# Patient Record
Sex: Male | Born: 1985 | Race: White | Hispanic: No | Marital: Single | Smoking: Current every day smoker
Health system: Southern US, Community
[De-identification: ages and names within clinical notes are randomized; demographics above are authoritative.]

## PROBLEM LIST (undated history)

## (undated) DIAGNOSIS — H547 Unspecified visual loss: Secondary | ICD-10-CM

## (undated) DIAGNOSIS — Z8781 Personal history of (healed) traumatic fracture: Secondary | ICD-10-CM

## (undated) HISTORY — PX: EYE SURGERY: SHX253

---
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
## 2005-04-10 ENCOUNTER — Emergency Department (HOSPITAL_COMMUNITY): Admission: EM | Admit: 2005-04-10 | Discharge: 2005-04-10 | Payer: Self-pay | Admitting: Emergency Medicine

## 2005-10-24 ENCOUNTER — Emergency Department (HOSPITAL_COMMUNITY): Admission: EM | Admit: 2005-10-24 | Discharge: 2005-10-24 | Payer: Self-pay | Admitting: Emergency Medicine

## 2009-02-14 ENCOUNTER — Emergency Department (HOSPITAL_COMMUNITY): Admission: EM | Admit: 2009-02-14 | Discharge: 2009-02-14 | Payer: Self-pay | Admitting: Emergency Medicine

## 2009-03-02 ENCOUNTER — Emergency Department (HOSPITAL_COMMUNITY): Admission: EM | Admit: 2009-03-02 | Discharge: 2009-03-02 | Payer: Self-pay | Admitting: Emergency Medicine

## 2010-04-09 ENCOUNTER — Emergency Department (HOSPITAL_COMMUNITY)
Admission: EM | Admit: 2010-04-09 | Discharge: 2010-04-09 | Discharge status: Against Medical Advice | Payer: Self-pay | Attending: Emergency Medicine | Admitting: Emergency Medicine

## 2010-04-09 DIAGNOSIS — H547 Unspecified visual loss: Secondary | ICD-10-CM | POA: Insufficient documentation

## 2010-04-09 DIAGNOSIS — H5710 Ocular pain, unspecified eye: Secondary | ICD-10-CM | POA: Insufficient documentation

## 2010-04-09 DIAGNOSIS — H538 Other visual disturbances: Secondary | ICD-10-CM | POA: Insufficient documentation

## 2010-04-27 ENCOUNTER — Emergency Department (HOSPITAL_COMMUNITY): Payer: Self-pay

## 2010-04-27 ENCOUNTER — Emergency Department (HOSPITAL_COMMUNITY)
Admission: EM | Admit: 2010-04-27 | Discharge: 2010-04-27 | Disposition: A | Discharge status: Home / Self Care | Payer: Self-pay | Attending: Emergency Medicine | Admitting: Emergency Medicine

## 2010-04-27 DIAGNOSIS — S12200A Unspecified displaced fracture of third cervical vertebra, initial encounter for closed fracture: Secondary | ICD-10-CM | POA: Insufficient documentation

## 2010-04-27 DIAGNOSIS — W108XXA Fall (on) (from) other stairs and steps, initial encounter: Secondary | ICD-10-CM | POA: Insufficient documentation

## 2010-04-27 DIAGNOSIS — F17200 Nicotine dependence, unspecified, uncomplicated: Secondary | ICD-10-CM | POA: Insufficient documentation

## 2010-04-27 DIAGNOSIS — M542 Cervicalgia: Secondary | ICD-10-CM | POA: Insufficient documentation

## 2010-04-27 DIAGNOSIS — S12300A Unspecified displaced fracture of fourth cervical vertebra, initial encounter for closed fracture: Secondary | ICD-10-CM | POA: Insufficient documentation

## 2010-05-26 NOTE — Consult Note (Signed)
NAMEBRONSEN, SERANO         ACCOUNT NO.:  [id number]  MEDICAL RECORD NO.:  [id number]           PATIENT TYPE:  LOCATION:                                 FACILITY:  PHYSICIAN:  Coletta Memos, M.D.     DATE OF BIRTH:  02/16/1985  DATE OF CONSULTATION: DATE OF DISCHARGE:                                CONSULTATION   CHIEF COMPLAINT:  Cervical spine fracture.  INDICATIONS:  Brendan Sutton is a 25 year old gentleman who while outside slipped on some stairs 2 days ago.  He fell backward hitting his head and then he says he flipped over and hit his head again on the ground. He afterwards had pain in his neck which is increased over the last few days.  He also had some pain in his right lower extremity that improved. He has not had any weakness.  He has had no problems with his balance. No problems with bowel or bladder function.  He has had no significant numbness or tingling.  He says that the pain simply became too bad for him to deal with at home.  He had to be helped of out of bed today, because whenever he tried to raise his head he had a significant amount of pain.  ALLERGIES:  He has some allergies, but no known drug allergies.  SOCIAL HISTORY:  He has no history of drug abuse.  He does smoke tobacco.  He does use alcohol occasionally.  PAST SURGICAL HISTORY:  No previous surgeries.  FAMILY HISTORY:  Provided no family history of medical problems.  REVIEW OF SYSTEMS:  Negative for constitutional, ear, nose, throat, cardiovascular, genitourinary, skin, neurological, psychiatric, endocrine, hematologic, gastrointestinal or allergic problems.  He simply reports neck pain for the last few days.  He does have some abrasions about his arms, his upper extremities and he says that those are due to his fall.  PHYSICAL EXAMINATION:  VITAL SIGNS:  Blood pressure 137/86, pulse 92, respiratory rate 18, temperature 98.2. GENERAL:  He is alert and oriented x4 and answering all  questions appropriately.  Memory language, attention, span and fund of knowledge normal.  Well kempt and in no distress. NEUROLOGIC:  Pupils equal, round, and reactive to light.  Full extraocular movements.  Full visual fields.  Hearing intact to finger rub bilaterally.  Uvula elevates midline.  Shoulder shrug is normal. Tongue protrudes in midline.  Speech is clear and fluent.  Fund of knowledge is normal.  5/5 strength in the upper and lower extremities. 2+ reflexes in the upper and lower extremities.  Intact proprioception. No Hoffman sign.  No clonus.  Toes downgoing.  He has a normal gait. Negative Romberg test. LUNG:  Fields clear. HEART:  Regular rhythm and rate.  No murmurs or rubs.  Pulses are good at the wrist bilaterally.  No cervical masses or bruits.  CT shows a fracture of the pedicle on the right side at C3 which involves the superior articulating process.  He also has a fracture of C4 on the right side in the inferior articular process and lamina.  He maintains a normal alignment.  He does have what almost look like old  compression fractures despite he gives no prior history of significant trauma to his cervical spine at C5 and also slightly so at C6.  Spinal canal is widely patent on the CT and plain films.  I will provide Brendan Sutton with a prescription for pain medication and muscle relaxant.  He says this pain is in 8/10 now, but as I explained some he did break his neck and his expected that he is going to have neck pain.  He has two collars are [place] collar and an Biochemist, clinical.  I instructed him to wear the [place] collar when he takes a shower, to remove that while lying down and use in place the [place] collar.  He needs to wear that 24 hours per day.  I will give him a return appointment to see me in approximately 1 month with plain films for assessment of the cervical spine alignment.          ______________________________ Coletta Memos,  M.D.     KC/MEDQ  D:  04/27/2010  T:  04/28/2010  Job:  [id number]  Electronically Signed by Coletta Memos M.D. on 05/26/2010 11:46:08 AM

## 2010-06-28 ENCOUNTER — Emergency Department (HOSPITAL_COMMUNITY): Payer: Self-pay

## 2010-06-28 ENCOUNTER — Emergency Department (HOSPITAL_COMMUNITY)
Admission: EM | Admit: 2010-06-28 | Discharge: 2010-06-29 | Disposition: A | Discharge status: Home / Self Care | Payer: Self-pay | Attending: Emergency Medicine | Admitting: Emergency Medicine

## 2010-06-28 DIAGNOSIS — S61409A Unspecified open wound of unspecified hand, initial encounter: Secondary | ICD-10-CM | POA: Insufficient documentation

## 2010-06-28 DIAGNOSIS — IMO0002 Reserved for concepts with insufficient information to code with codable children: Secondary | ICD-10-CM | POA: Insufficient documentation

## 2010-06-28 DIAGNOSIS — S0510XA Contusion of eyeball and orbital tissues, unspecified eye, initial encounter: Secondary | ICD-10-CM | POA: Insufficient documentation

## 2010-06-28 DIAGNOSIS — H5789 Other specified disorders of eye and adnexa: Secondary | ICD-10-CM | POA: Insufficient documentation

## 2010-06-28 DIAGNOSIS — H538 Other visual disturbances: Secondary | ICD-10-CM | POA: Insufficient documentation

## 2010-06-28 DIAGNOSIS — S058X9A Other injuries of unspecified eye and orbit, initial encounter: Secondary | ICD-10-CM | POA: Insufficient documentation

## 2010-06-28 DIAGNOSIS — R51 Headache: Secondary | ICD-10-CM | POA: Insufficient documentation

## 2010-06-28 DIAGNOSIS — H5710 Ocular pain, unspecified eye: Secondary | ICD-10-CM | POA: Insufficient documentation

## 2010-06-28 DIAGNOSIS — M542 Cervicalgia: Secondary | ICD-10-CM | POA: Insufficient documentation

## 2010-06-28 DIAGNOSIS — H1130 Conjunctival hemorrhage, unspecified eye: Secondary | ICD-10-CM | POA: Insufficient documentation

## 2010-06-28 DIAGNOSIS — G8929 Other chronic pain: Secondary | ICD-10-CM | POA: Insufficient documentation

## 2010-09-11 ENCOUNTER — Emergency Department (HOSPITAL_COMMUNITY)
Admission: EM | Admit: 2010-09-11 | Discharge: 2010-09-11 | Discharge status: Against Medical Advice | Payer: Self-pay | Attending: Emergency Medicine | Admitting: Emergency Medicine

## 2010-09-11 DIAGNOSIS — T148XXA Other injury of unspecified body region, initial encounter: Secondary | ICD-10-CM | POA: Insufficient documentation

## 2010-09-12 ENCOUNTER — Inpatient Hospital Stay (INDEPENDENT_AMBULATORY_CARE_PROVIDER_SITE_OTHER)
Admission: RE | Admit: 2010-09-12 | Discharge: 2010-09-12 | Disposition: A | Discharge status: Home / Self Care | Payer: Self-pay | Source: Ambulatory Visit | Attending: Emergency Medicine | Admitting: Emergency Medicine

## 2010-09-12 ENCOUNTER — Ambulatory Visit (INDEPENDENT_AMBULATORY_CARE_PROVIDER_SITE_OTHER): Payer: Self-pay

## 2010-09-12 DIAGNOSIS — IMO0002 Reserved for concepts with insufficient information to code with codable children: Secondary | ICD-10-CM

## 2010-09-12 DIAGNOSIS — S5000XA Contusion of unspecified elbow, initial encounter: Secondary | ICD-10-CM

## 2011-09-03 ENCOUNTER — Emergency Department (HOSPITAL_COMMUNITY): Payer: Self-pay

## 2011-09-03 ENCOUNTER — Encounter (HOSPITAL_COMMUNITY): Payer: Self-pay | Admitting: Family Medicine

## 2011-09-03 ENCOUNTER — Emergency Department (HOSPITAL_COMMUNITY)
Admission: EM | Admit: 2011-09-03 | Discharge: 2011-09-03 | Disposition: A | Discharge status: Home / Self Care | Payer: Self-pay | Attending: Emergency Medicine | Admitting: Emergency Medicine

## 2011-09-03 DIAGNOSIS — K529 Noninfective gastroenteritis and colitis, unspecified: Secondary | ICD-10-CM

## 2011-09-03 DIAGNOSIS — R1031 Right lower quadrant pain: Secondary | ICD-10-CM | POA: Insufficient documentation

## 2011-09-03 DIAGNOSIS — R109 Unspecified abdominal pain: Secondary | ICD-10-CM

## 2011-09-03 LAB — CBC WITH DIFFERENTIAL/PLATELET
Basophils Absolute: 0.1 10*3/uL (ref 0.0–0.1)
Basophils Relative: 1 % (ref 0–1)
Eosinophils Absolute: 0.4 10*3/uL (ref 0.0–0.7)
Eosinophils Relative: 4 % (ref 0–5)
Lymphocytes Relative: 39 % (ref 12–46)
MCH: 30.8 pg (ref 26.0–34.0)
MCHC: 34.8 g/dL (ref 30.0–36.0)
MCV: 88.7 fL (ref 78.0–100.0)
Platelets: 347 10*3/uL (ref 150–400)
RDW: 13.5 % (ref 11.5–15.5)
WBC: 8.9 10*3/uL (ref 4.0–10.5)

## 2011-09-03 LAB — URINALYSIS, ROUTINE W REFLEX MICROSCOPIC
Hgb urine dipstick: NEGATIVE
Protein, ur: NEGATIVE mg/dL
Urobilinogen, UA: 0.2 mg/dL (ref 0.0–1.0)

## 2011-09-03 LAB — COMPREHENSIVE METABOLIC PANEL
ALT: 21 U/L (ref 0–53)
AST: 17 U/L (ref 0–37)
Albumin: 4.1 g/dL (ref 3.5–5.2)
Calcium: 9.7 mg/dL (ref 8.4–10.5)
Sodium: 141 mEq/L (ref 135–145)
Total Protein: 7.4 g/dL (ref 6.0–8.3)

## 2011-09-03 MED ORDER — SODIUM CHLORIDE 0.9 % IV BOLUS (SEPSIS)
1000.0000 mL | INTRAVENOUS | Status: AC
  Administered 2011-09-03: 1000 mL via INTRAVENOUS

## 2011-09-03 MED ORDER — OXYCODONE-ACETAMINOPHEN 5-325 MG PO TABS: 1.0000 | ORAL_TABLET | Freq: Four times a day (QID) | ORAL | Status: AC | PRN

## 2011-09-03 MED ORDER — MORPHINE SULFATE 4 MG/ML IJ SOLN
4.0000 mg | Freq: Once | INTRAMUSCULAR | Status: AC
  Administered 2011-09-03: 4 mg via INTRAVENOUS
  Filled 2011-09-03: qty 1

## 2011-09-03 MED ORDER — IOHEXOL 300 MG/ML  SOLN
100.0000 mL | Freq: Once | INTRAMUSCULAR | Status: AC | PRN
  Administered 2011-09-03: 100 mL via INTRAVENOUS

## 2011-09-03 MED ORDER — ONDANSETRON HCL 4 MG/2ML IJ SOLN
4.0000 mg | INTRAMUSCULAR | Status: AC
  Administered 2011-09-03: 4 mg via INTRAVENOUS
  Filled 2011-09-03: qty 2

## 2011-09-03 NOTE — ED Notes (Addendum)
Patient C/O abdominal pain that has been ongoing for 3 days.  Worse pain is in RUQ.  Bowel sounds are present X 4.  RLQ and LLQ are non tender to palpation.  RUQ is tender to palpation. Palpating LUQ makes RUQ hurt worse. Denies Nausea, Vomiting  And diarrhea. States that he is eating well.  Denies fever.

## 2011-09-03 NOTE — ED Notes (Signed)
Patient was explained discharge instructions and he had no questions. Patient's significant other is transporting home.

## 2011-09-03 NOTE — ED Notes (Signed)
Per pt sts RLQ pain radiating across his abdomen since yesterday. Denies N,V,D. Denies urinary symptoms.

## 2011-09-03 NOTE — ED Provider Notes (Signed)
Patient care passed on from [facility], PA-C. 26 y/o male with abdominal pain moved to CDU to await CT to r/o appendicitis. CT scan unremarkable. Patient admits to eating a hamburger that had freezer burn on Saturday prior to onset of symptoms. Denies any nausea or vomiting. Pain currently 6/10 down from 8/10. On exam, patient in NAD. Heart RRR. Lungs CTA. Abdomen soft with normal BS. TTP of right mid-upper quadrant and mid-epigastric region. No guarding or rebound tenderness. Will discharge with instructions for a bland diet and pain control.  Trevor Mace, PA-C 09/03/11 1954

## 2011-09-03 NOTE — ED Notes (Signed)
Called CT to notify pt finished with contrast

## 2011-09-03 NOTE — ED Provider Notes (Signed)
History     CSN: [id number]  Arrival date & time 09/03/11  1457   First MD Initiated Contact with Patient 09/03/11 1624      Chief Complaint  Patient presents with  . Abdominal Pain    (Consider location/radiation/quality/duration/timing/severity/associated sxs/prior treatment) The history is provided by the patient, medical records and the spouse.   Brendan Sutton is a 26 y.o. male presents to the emergency department complaining of abdominal pain.  The onset of the symptoms was  gradual starting 3 days ago.  The patient has no associated symptoms.  The symptoms have been  constant, gradually worsened.  Increasing abdominal pressure makes the symptoms worse and nothing makes symptoms better.  The patient denies fever, chills, headache, chest pain, shortness of breath, , vomiting, diarrhea constipation.  Patient states the pain began Saturday afternoon after spending time with his niece waterslide.  It did not really bother him on Saturday however pain increased to a 10 out of 10 yesterday.  He has tried TUMS, Maalox, Pepto-Bismol without relief.  He states the pain was initially located periumbilical and is now more intense in the right lower quadrant and is radiating into the left lower quadrant.  Describes the pain as sharp and constant, currently rated a 7/10.  He's been afebrile at home.  CT had an appetite and has been able to eat and drink normally.  The patient has medical history significant for: History reviewed. No pertinent past medical history.   History reviewed. No pertinent past medical history.  History reviewed. No pertinent past surgical history.  History reviewed. No pertinent family history.  History  Substance Use Topics  . Smoking status: Current Everyday Smoker  . Smokeless tobacco: Not on file  . Alcohol Use: Yes      Review of Systems  Constitutional: Negative for fever, diaphoresis, appetite change, fatigue and unexpected weight change.  HENT:  Negative for mouth sores, trouble swallowing, neck pain and neck stiffness.   Respiratory: Negative for cough, chest tightness, shortness of breath, wheezing and stridor.   Cardiovascular: Negative for chest pain and palpitations.  Gastrointestinal: Positive for abdominal pain. Negative for nausea, vomiting, diarrhea, constipation, blood in stool, abdominal distention and rectal pain.  Genitourinary: Negative for dysuria, urgency, frequency, hematuria, flank pain and difficulty urinating.  Musculoskeletal: Negative for back pain.  Skin: Negative for rash.  Neurological: Negative for weakness.  Hematological: Negative for adenopathy.  Psychiatric/Behavioral: Negative for confusion.  All other systems reviewed and are negative.    Allergies  Review of patient's allergies indicates no known allergies.  Home Medications   Current Outpatient Rx  Name Route Sig Dispense Refill  . IBUPROFEN 200 MG PO TABS Oral Take 400 mg by mouth every 6 (six) hours as needed. For pain      BP 133/80  Pulse 98  Temp 97.9 F (36.6 C) (Oral)  Resp 16  SpO2 98%  Physical Exam  Nursing note and vitals reviewed. Constitutional: He appears well-developed and well-nourished.  HENT:  Head: Normocephalic and atraumatic.  Mouth/Throat: Oropharynx is clear and moist.  Eyes: Conjunctivae are normal. No scleral icterus.  Cardiovascular: Normal rate, regular rhythm and intact distal pulses.   Pulmonary/Chest: Effort normal and breath sounds normal.  Abdominal: Soft. Normal appearance and bowel sounds are normal. He exhibits no distension and no mass. There is no hepatosplenomegaly. There is tenderness (worst in the right lower quadrant) in the right lower quadrant, periumbilical area, suprapubic area and left lower quadrant. There is rebound,  guarding and tenderness at McBurney's point. There is no CVA tenderness and negative Murphy's sign.       Positive Rovsing's   Neurological: He is alert.  Skin: Skin is  warm and dry. No rash noted.  Psychiatric: He has a normal mood and affect.    ED Course  Procedures (including critical care time)  Labs Reviewed  CBC WITH DIFFERENTIAL - Abnormal; Notable for the following:    Monocytes Absolute 1.1 (*)     All other components within normal limits  COMPREHENSIVE METABOLIC PANEL - Abnormal; Notable for the following:    Total Bilirubin 0.1 (*)     All other components within normal limits  URINALYSIS, ROUTINE W REFLEX MICROSCOPIC   No results found.  Results for orders placed during the hospital encounter of 09/03/11  URINALYSIS, ROUTINE W REFLEX MICROSCOPIC      Component Value Range   Color, Urine YELLOW  YELLOW   APPearance CLEAR  CLEAR   Specific Gravity, Urine 1.023  1.005 - 1.030   pH 6.5  5.0 - 8.0   Glucose, UA NEGATIVE  NEGATIVE mg/dL   Hgb urine dipstick NEGATIVE  NEGATIVE   Bilirubin Urine NEGATIVE  NEGATIVE   Ketones, ur NEGATIVE  NEGATIVE mg/dL   Protein, ur NEGATIVE  NEGATIVE mg/dL   Urobilinogen, UA 0.2  0.0 - 1.0 mg/dL   Nitrite NEGATIVE  NEGATIVE   Leukocytes, UA NEGATIVE  NEGATIVE  CBC WITH DIFFERENTIAL      Component Value Range   WBC 8.9  4.0 - 10.5 K/uL   RBC 5.22  4.22 - 5.81 MIL/uL   Hemoglobin 16.1  13.0 - 17.0 g/dL   HCT 16.1  09.6 - 04.5 %   MCV 88.7  78.0 - 100.0 fL   MCH 30.8  26.0 - 34.0 pg   MCHC 34.8  30.0 - 36.0 g/dL   RDW 40.9  81.1 - 91.4 %   Platelets 347  150 - 400 K/uL   Neutrophils Relative 44  43 - 77 %   Neutro Abs 4.0  1.7 - 7.7 K/uL   Lymphocytes Relative 39  12 - 46 %   Lymphs Abs 3.4  0.7 - 4.0 K/uL   Monocytes Relative 12  3 - 12 %   Monocytes Absolute 1.1 (*) 0.1 - 1.0 K/uL   Eosinophils Relative 4  0 - 5 %   Eosinophils Absolute 0.4  0.0 - 0.7 K/uL   Basophils Relative 1  0 - 1 %   Basophils Absolute 0.1  0.0 - 0.1 K/uL  COMPREHENSIVE METABOLIC PANEL      Component Value Range   Sodium 141  135 - 145 mEq/L   Potassium 5.1  3.5 - 5.1 mEq/L   Chloride 106  96 - 112 mEq/L   CO2  27  19 - 32 mEq/L   Glucose, Bld 91  70 - 99 mg/dL   BUN 15  6 - 23 mg/dL   Creatinine, Ser 7.82  0.50 - 1.35 mg/dL   Calcium 9.7  8.4 - 95.6 mg/dL   Total Protein 7.4  6.0 - 8.3 g/dL   Albumin 4.1  3.5 - 5.2 g/dL   AST 17  0 - 37 U/L   ALT 21  0 - 53 U/L   Alkaline Phosphatase 66  39 - 117 U/L   Total Bilirubin 0.1 (*) 0.3 - 1.2 mg/dL   GFR calc non Af Amer >90  >90 mL/min   GFR calc Af Amer >  90  >90 mL/min   No results found.    1. Abdominal pain       MDM  Arrie Senate presents with abdominal pain x3 days.  Urinalysis, CMP, CBC all unremarkable.  Physical exam concerning for possible appendicitis.  Will order abdominal CT, fluids and pain control. If abdominal CT negative we'll discharge home with close follow-up and strict instructions for return.  Pt transferred to the CDU to await CT scan.  Pain under control at this time.         Dahlia Client Lindon Kiel, PA-C 09/03/11 1821

## 2011-09-04 NOTE — ED Provider Notes (Signed)
Medical screening examination/treatment/procedure(s) were performed by non-physician practitioner and as supervising physician I was immediately available for consultation/collaboration.  Juliet Rude. Rubin Payor, MD 09/04/11 1446

## 2011-09-04 NOTE — ED Provider Notes (Signed)
Medical screening examination/treatment/procedure(s) were performed by non-physician practitioner and as supervising physician I was immediately available for consultation/collaboration. [facility]  Juliet Rude. Rubin Payor, MD 09/04/11 1446

## 2011-10-16 ENCOUNTER — Emergency Department (INDEPENDENT_AMBULATORY_CARE_PROVIDER_SITE_OTHER)
Admission: EM | Admit: 2011-10-16 | Discharge: 2011-10-16 | Disposition: A | Discharge status: Home / Self Care | Payer: Self-pay | Source: Home / Self Care | Attending: Family Medicine | Admitting: Family Medicine

## 2011-10-16 ENCOUNTER — Encounter (HOSPITAL_COMMUNITY): Payer: Self-pay | Admitting: Emergency Medicine

## 2011-10-16 DIAGNOSIS — J029 Acute pharyngitis, unspecified: Secondary | ICD-10-CM

## 2011-10-16 MED ORDER — HYDROCODONE-ACETAMINOPHEN 5-500 MG PO TABS: ORAL_TABLET | ORAL | Status: DC

## 2011-10-16 NOTE — ED Provider Notes (Signed)
History     CSN: [id number]  Arrival date & time 10/16/11  1719   First MD Initiated Contact with Patient 10/16/11 1755      No chief complaint on file.   (Consider location/radiation/quality/duration/timing/severity/associated sxs/prior treatment) HPI Comments: Sore throat since yest.  + subj fever.  Mild cough  The history is provided by the patient. No language interpreter was used.    History reviewed. No pertinent past medical history.  History reviewed. No pertinent past surgical history.  No family history on file.  History  Substance Use Topics  . Smoking status: Current Every Day Smoker -- 0.5 packs/day  . Smokeless tobacco: Not on file  . Alcohol Use: Yes      Review of Systems  Constitutional: Positive for fever. Negative for chills.  HENT: Positive for sore throat.   Respiratory: Positive for cough.     Allergies  Review of patient's allergies indicates no known allergies.  Home Medications   Current Outpatient Rx  Name Route Sig Dispense Refill  . IBUPROFEN 200 MG PO TABS Oral Take 400 mg by mouth every 6 (six) hours as needed. For pain      BP 137/79  Pulse 68  Temp 97.7 F (36.5 C) (Oral)  Resp 16  SpO2 99%  Physical Exam  Nursing note and vitals reviewed. Constitutional: He is oriented to person, place, and time. He appears well-developed and well-nourished.  HENT:  Head: Normocephalic and atraumatic.  Mouth/Throat: Uvula is midline and mucous membranes are normal. No uvula swelling. Posterior oropharyngeal erythema present. No oropharyngeal exudate, posterior oropharyngeal edema or tonsillar abscesses.  Eyes: EOM are normal.  Neck: Normal range of motion.  Cardiovascular: Normal rate, regular rhythm, normal heart sounds and intact distal pulses.   Pulmonary/Chest: Effort normal and breath sounds normal. No respiratory distress.  Abdominal: Soft. He exhibits no distension. There is no tenderness.  Musculoskeletal: Normal range of  motion.  Lymphadenopathy:       Right cervical: No superficial cervical and no deep cervical adenopathy present.      Left cervical: No superficial cervical and no deep cervical adenopathy present.  Neurological: He is alert and oriented to person, place, and time.  Skin: Skin is warm and dry.  Psychiatric: He has a normal mood and affect. Judgment normal.    ED Course  Procedures (including critical care time)   Labs Reviewed  POCT RAPID STREP A ([facility]ARE ONLY)   No results found.   1. Pharyngitis       MDM  rx-hydrocodone, 12 Ibuprofen 800 mg TID        Evalina Field, PA 10/16/11 1904

## 2011-10-16 NOTE — ED Provider Notes (Signed)
Medical screening examination/treatment/procedure(s) were performed by resident physician or non-physician practitioner and as supervising physician I was immediately available for consultation/collaboration.   Trashawn Oquendo DOUGLAS MD.    Relda Agosto D Daxton Nydam, MD 10/16/11 2101 

## 2011-10-16 NOTE — ED Notes (Signed)
Started last pm with sorethroat. States is unable to eat due to pain.

## 2012-01-03 ENCOUNTER — Encounter (HOSPITAL_COMMUNITY): Payer: Self-pay | Admitting: *Deleted

## 2012-01-03 ENCOUNTER — Emergency Department (HOSPITAL_COMMUNITY)
Admission: EM | Admit: 2012-01-03 | Discharge: 2012-01-03 | Discharge status: Against Medical Advice | Payer: Self-pay | Attending: Emergency Medicine | Admitting: Emergency Medicine

## 2012-01-03 DIAGNOSIS — R209 Unspecified disturbances of skin sensation: Secondary | ICD-10-CM | POA: Insufficient documentation

## 2012-01-03 DIAGNOSIS — F17200 Nicotine dependence, unspecified, uncomplicated: Secondary | ICD-10-CM | POA: Insufficient documentation

## 2012-01-03 DIAGNOSIS — M25539 Pain in unspecified wrist: Secondary | ICD-10-CM | POA: Insufficient documentation

## 2012-01-03 NOTE — ED Notes (Signed)
The pt has pain in his rt wrist and forearm.  He was observed shaking all over but continued to talk during the shaking.   No history of seizures.  He reoports that he cannot  Move his wrist

## 2012-01-03 NOTE — ED Notes (Signed)
Unable to locate patient x3.

## 2012-01-03 NOTE — ED Notes (Signed)
Unable to locate patient x2.

## 2012-01-03 NOTE — ED Notes (Signed)
Mother called ED.  She thinks patient is having mini strokes and patient is having numbness in hand.  Patient is able to move arm and states that he can not move hand.  Patient is speaking in complete sentences and no slurred speech or garbled speech noted.

## 2012-01-04 ENCOUNTER — Emergency Department (HOSPITAL_COMMUNITY)
Admission: EM | Admit: 2012-01-04 | Discharge: 2012-01-05 | Disposition: A | Discharge status: Home / Self Care | Payer: Self-pay | Attending: Emergency Medicine | Admitting: Emergency Medicine

## 2012-01-04 ENCOUNTER — Encounter (HOSPITAL_COMMUNITY): Payer: Self-pay | Admitting: Emergency Medicine

## 2012-01-04 ENCOUNTER — Emergency Department (HOSPITAL_COMMUNITY): Payer: Self-pay

## 2012-01-04 DIAGNOSIS — Y929 Unspecified place or not applicable: Secondary | ICD-10-CM | POA: Insufficient documentation

## 2012-01-04 DIAGNOSIS — R209 Unspecified disturbances of skin sensation: Secondary | ICD-10-CM | POA: Insufficient documentation

## 2012-01-04 DIAGNOSIS — W1809XA Striking against other object with subsequent fall, initial encounter: Secondary | ICD-10-CM | POA: Insufficient documentation

## 2012-01-04 DIAGNOSIS — F17200 Nicotine dependence, unspecified, uncomplicated: Secondary | ICD-10-CM | POA: Insufficient documentation

## 2012-01-04 DIAGNOSIS — Y939 Activity, unspecified: Secondary | ICD-10-CM | POA: Insufficient documentation

## 2012-01-04 DIAGNOSIS — G5630 Lesion of radial nerve, unspecified upper limb: Secondary | ICD-10-CM | POA: Insufficient documentation

## 2012-01-04 DIAGNOSIS — R404 Transient alteration of awareness: Secondary | ICD-10-CM | POA: Insufficient documentation

## 2012-01-04 NOTE — ED Notes (Signed)
Pt called for xray with no response. 

## 2012-01-04 NOTE — ED Notes (Signed)
Pt had come home from work and was moving into new room with roommate. His roommate told pt he fell and hit his head, when pt woke up his right arm was in pain and twitching. Hand is curved in a flexed position, fingers are numb and tingling, hand is cold. Pt states pain extends to his right elbow. A&Ox4, ambulatory, stable.

## 2012-01-04 NOTE — ED Notes (Signed)
PT. REPORTS RIGHT FOREARM PAIN / RIGHT HAND TINGLING FOR SEVERAL DAYS , STATES FELL AT HOME LAST Monday , PAIN WORSE WITH PALPATION AND CERTAIN POSITIONS.

## 2012-01-05 ENCOUNTER — Emergency Department (HOSPITAL_COMMUNITY): Payer: Self-pay

## 2012-01-05 MED ORDER — HYDROCODONE-ACETAMINOPHEN 5-325 MG PO TABS: 1.0000 | ORAL_TABLET | ORAL | Status: DC | PRN

## 2012-01-05 MED ORDER — HYDROCODONE-ACETAMINOPHEN 5-325 MG PO TABS
2.0000 | ORAL_TABLET | Freq: Once | ORAL | Status: AC
  Administered 2012-01-05: 2 via ORAL
  Filled 2012-01-05: qty 2

## 2012-01-05 NOTE — ED Notes (Signed)
Pt left AMA. RN called CT and Xray to see if patient was back there for scan. RN searched for patient around unit. Pt not found. PA notified. Pt will be discharged out the system.

## 2012-01-05 NOTE — ED Notes (Signed)
Ortho tech informed of order.

## 2012-01-05 NOTE — ED Notes (Signed)
Pt return from xray/ct scan

## 2012-01-05 NOTE — ED Provider Notes (Signed)
History     CSN: [id number]  Arrival date & time 01/04/12  1924   None     Chief Complaint  Patient presents with  . Arm Pain    (Consider location/radiation/quality/duration/timing/severity/associated sxs/prior treatment) HPI History provided by pt and his wife.  Patient's wife says that patient fell 5 days ago, hit his head, had LOC followed by brief confusion as well as drawling up and tremors of RUE.  Pt reports that since this accident, he has pain in right forearm and is unable to extend his right wrist.  Associated w/ paresthesias of medial aspect of hand.  Denies fever, headache, neck pain, vision changes, dysarthria, dysphagia, weakness/paresthesias of other extremities, dizziness.   History reviewed. No pertinent past medical history.  History reviewed. No pertinent past surgical history.  No family history on file.  History  Substance Use Topics  . Smoking status: Current Every Day Smoker -- 0.5 packs/day  . Smokeless tobacco: Not on file  . Alcohol Use: Yes      Review of Systems  All other systems reviewed and are negative.    Allergies  Review of patient's allergies indicates no known allergies.  Home Medications   Current Outpatient Rx  Name  Route  Sig  Dispense  Refill  . ACETAMINOPHEN 500 MG PO TABS   Oral   Take 1,000 mg by mouth every 6 (six) hours as needed. For pain         . HYDROCODONE-ACETAMINOPHEN 5-325 MG PO TABS   Oral   Take 1 tablet by mouth every 4 (four) hours as needed for pain.   20 tablet   0     BP 115/80  Pulse 110  Temp 98.3 F (36.8 C) (Oral)  Resp 18  SpO2 98%  Physical Exam  Nursing note and vitals reviewed. Constitutional: He is oriented to person, place, and time. He appears well-developed and well-nourished. No distress.  HENT:  Head: Normocephalic and atraumatic.  Eyes:       Normal appearance  Neck: Normal range of motion.  Cardiovascular: Normal rate, regular rhythm and intact distal pulses.     Pulmonary/Chest: Effort normal and breath sounds normal.  Musculoskeletal:       Right wrist flexed and floppy. Patient unable to extend.   ROM of extremities, including all 5 right fingers, otherwise nml.    Nml elbow and shoulder. Mild tenderness medial aspect of right forearm.  Neurological: He is alert and oriented to person, place, and time. No sensory deficit. Coordination normal.       CN 3-12 intact.  No nystagmus. 5/5 and equal upper and lower extremity strength w/ exception of 0/5 with wrist extension.  No past pointing.   nml gait    Skin: Skin is warm and dry. No rash noted.  Psychiatric: He has a normal mood and affect. His behavior is normal.    ED Course  Procedures (including critical care time)  Labs Reviewed - No data to display Dg Forearm Right  01/04/2012  *RADIOLOGY REPORT*  Clinical Data: Arm pain  RIGHT FOREARM - 2 VIEW  Comparison: None  Findings: There is no evidence of fracture or dislocation.  There is no evidence of arthropathy or other focal bone abnormality. Soft tissues are unremarkable.  IMPRESSION: Negative exam.   Original Report Authenticated By: Signa Kell, M.D.    Ct Head Wo Contrast  01/05/2012  *RADIOLOGY REPORT*  Clinical Data:  Status post fall.  Hit head.  CT HEAD WITHOUT  CONTRAST CT CERVICAL SPINE WITHOUT CONTRAST  Technique:  Multidetector CT imaging of the head and cervical spine was performed following the standard protocol without intravenous contrast.  Multiplanar CT image reconstructions of the cervical spine were also generated.  Comparison:  06/29/2010  CT HEAD  Findings: The brain has a normal appearance without evidence for hemorrhage, infarction, hydrocephalus, or mass lesion.  There is no extra axial fluid collection.  The skull and paranasal sinuses are normal.  IMPRESSION:  1.  No acute intracranial abnormalities.  CT CERVICAL SPINE  Findings: There is no evidence of cervical spine fracture. Alignment is normal.  Intervertebral disc  spaces are maintained.  IMPRESSION: Negative exam.   Original Report Authenticated By: Signa Kell, M.D.    Ct Cervical Spine Wo Contrast  01/05/2012  *RADIOLOGY REPORT*  Clinical Data:  Status post fall.  Hit head.  CT HEAD WITHOUT CONTRAST CT CERVICAL SPINE WITHOUT CONTRAST  Technique:  Multidetector CT imaging of the head and cervical spine was performed following the standard protocol without intravenous contrast.  Multiplanar CT image reconstructions of the cervical spine were also generated.  Comparison:  06/29/2010  CT HEAD  Findings: The brain has a normal appearance without evidence for hemorrhage, infarction, hydrocephalus, or mass lesion.  There is no extra axial fluid collection.  The skull and paranasal sinuses are normal.  IMPRESSION:  1.  No acute intracranial abnormalities.  CT CERVICAL SPINE  Findings: There is no evidence of cervical spine fracture. Alignment is normal.  Intervertebral disc spaces are maintained.  IMPRESSION: Negative exam.   Original Report Authenticated By: Signa Kell, M.D.    Dg Humerus Right  01/05/2012  *RADIOLOGY REPORT*  Clinical Data: Right upper arm pain, radiating to the proximal forearm.  Injury while moving heavy tool.  RIGHT HUMERUS - 2+ VIEW  Comparison: Right elbow radiographs performed 09/12/2010  Findings: There is no evidence of fracture or dislocation.  The right humerus appears intact.  The right humeral head remains seated at the glenoid fossa.  The elbow joint is incompletely assessed, but appears grossly unremarkable.  No significant soft tissue abnormalities are characterized on radiograph.  IMPRESSION: No evidence of fracture or dislocation.   Original Report Authenticated By: Tonia Ghent, M.D.      1. Radial nerve palsy       MDM  26yo M fell, hitting head, 6 days ago.  Presents w/ right wrist drop that started same day.  Exam sig for nml neuro exam w/ exception of inability to actively extend right wrist.  Dr. Verl Bangs saw patient  and recommended CT head and cervical spine, both of which were negative.  Pt referred to Hand.  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  Received one vicodin for forearm pain in ED and d/c'd home w/ same.  Ortho tech placed in volar splint.  Return precautions discussed.         Otilio Miu, PA-C 01/05/12 [id number]

## 2012-01-05 NOTE — ED Notes (Signed)
Pt back in room from scans

## 2012-01-05 NOTE — Progress Notes (Signed)
Orthopedic Tech Progress Note Patient Details:  Brendan Sutton 02-02-1985 [id number]  Ortho Devices Type of Ortho Device: Velcro wrist splint   Haskell Flirt 01/05/2012, 1:41 AM

## 2012-01-05 NOTE — ED Notes (Signed)
Pt not in room for ct/xray

## 2012-01-05 NOTE — ED Notes (Signed)
Pt went outside to smoke. Pt was out of waiting room and room and was taken out of room/discharged AMA. Pt returned after smoking and wanted treatment. PA aware, RN aware, X-ray and CT aware to come get pt from wait room.

## 2012-01-05 NOTE — ED Notes (Signed)
Xray and CT informed of pt's return. PA aware.

## 2012-01-08 NOTE — ED Provider Notes (Signed)
Medical screening examination/treatment/procedure(s) were performed by non-physician practitioner and as supervising physician I was immediately available for consultation/collaboration.  Marcos Peloso K Farris Geiman, MD 01/08/12 0827 

## 2012-01-15 ENCOUNTER — Emergency Department (HOSPITAL_COMMUNITY): Payer: Self-pay

## 2012-01-15 ENCOUNTER — Emergency Department (HOSPITAL_COMMUNITY)
Admission: EM | Admit: 2012-01-15 | Discharge: 2012-01-15 | Disposition: A | Discharge status: Home / Self Care | Payer: Self-pay | Attending: Emergency Medicine | Admitting: Emergency Medicine

## 2012-01-15 ENCOUNTER — Encounter (HOSPITAL_COMMUNITY): Payer: Self-pay | Admitting: Emergency Medicine

## 2012-01-15 DIAGNOSIS — G5630 Lesion of radial nerve, unspecified upper limb: Secondary | ICD-10-CM | POA: Insufficient documentation

## 2012-01-15 DIAGNOSIS — F17200 Nicotine dependence, unspecified, uncomplicated: Secondary | ICD-10-CM | POA: Insufficient documentation

## 2012-01-15 MED ORDER — HYDROCODONE-ACETAMINOPHEN 5-325 MG PO TABS: 1.0000 | ORAL_TABLET | Freq: Four times a day (QID) | ORAL | Status: DC | PRN

## 2012-01-15 MED ORDER — PREDNISONE (PAK) 10 MG PO TABS: ORAL_TABLET | ORAL | Status: DC

## 2012-01-15 MED ORDER — OXYCODONE-ACETAMINOPHEN 5-325 MG PO TABS
2.0000 | ORAL_TABLET | Freq: Once | ORAL | Status: AC
  Administered 2012-01-15: 2 via ORAL
  Filled 2012-01-15: qty 2

## 2012-01-15 NOTE — ED Notes (Signed)
Ortho paged. 

## 2012-01-15 NOTE — Progress Notes (Signed)
Orthopedic Tech Progress Note Patient Details:  Brendan Sutton 1985-11-18 [id number]  Ortho Devices Type of Ortho Device: Arm sling Ortho Device/Splint Location: RIGHT ARM SLING Ortho Device/Splint Interventions: Application   Cammer, Mickie Bail 01/15/2012, 5:05 PM

## 2012-01-15 NOTE — ED Notes (Signed)
Pt here with right forearm pain; pt sts seen here for same and lost referral

## 2012-01-15 NOTE — ED Provider Notes (Signed)
History  This chart was scribed for non-physician practitioner working with Gerhard Munch, MD by Erskine Emery, ED Scribe. This patient was seen in room TR05C/TR05C and the patient's care was started at 15:21.   CSN: [id number]  Arrival date & time 01/15/12  1423   First MD Initiated Contact with Patient 01/15/12 1521      Chief Complaint  Patient presents with  . Arm Pain    (Consider location/radiation/quality/duration/timing/severity/associated sxs/prior treatment) The history is provided by the patient. No language interpreter was used.  Brendan Sutton is a 27 y.o. male who presents to the Emergency Department complaining of right forearm pain for the last several days. Pt reports he was here previously for the same complaint and was given a prescription and a referral to see a hand doctor. Pt reports he was not able to see the hand surgeon directly after his last visit because he had to work and he has since run out of the hydrocodone, which he has been taking as needed. Pt has a h/o broken neck and reports this pain is worse than that episode. Pt reports he was wearing the splint they gave him but it causes him pain so he stopped wearing it.   History reviewed. No pertinent past medical history.  History reviewed. No pertinent past surgical history.  History reviewed. No pertinent family history.  History  Substance Use Topics  . Smoking status: Current Every Day Smoker -- 0.5 packs/day  . Smokeless tobacco: Not on file  . Alcohol Use: Yes      Review of Systems  Constitutional: Negative for fever and chills.  Respiratory: Negative for shortness of breath.   Gastrointestinal: Negative for nausea and vomiting.  Musculoskeletal:       Right forearm pain.  Skin: Negative for wound.  Neurological: Negative for weakness.  All other systems reviewed and are negative.    Allergies  Review of patient's allergies indicates no known allergies.  Home Medications    Current Outpatient Rx  Name  Route  Sig  Dispense  Refill  . ACETAMINOPHEN 500 MG PO TABS   Oral   Take 1,000 mg by mouth every 6 (six) hours as needed. For pain         . HYDROCODONE-ACETAMINOPHEN 5-325 MG PO TABS   Oral   Take 1 tablet by mouth every 4 (four) hours as needed for pain.   20 tablet   0   . IBUPROFEN 200 MG PO TABS   Oral   Take 600 mg by mouth every 6 (six) hours as needed. For pain           Triage Vitals: BP 137/120  Pulse 97  Temp 98.4 F (36.9 C) (Oral)  Resp 18  SpO2 96%  Physical Exam  Nursing note and vitals reviewed. Constitutional: He is oriented to person, place, and time. He appears well-developed and well-nourished. No distress.  HENT:  Head: Normocephalic and atraumatic.  Eyes: EOM are normal. Pupils are equal, round, and reactive to light.  Neck: Neck supple. No tracheal deviation present.  Cardiovascular: Normal rate.   Pulmonary/Chest: Effort normal. No respiratory distress.  Abdominal: Soft. He exhibits no distension.  Musculoskeletal: Normal range of motion. He exhibits no edema.       Tender in the right forearm, just below the elbow. Floppy right wrist.  Neurological: He is alert and oriented to person, place, and time.  Skin: Skin is warm and dry.  Psychiatric: He has a normal mood  and affect.    ED Course  Procedures (including critical care time) DIAGNOSTIC STUDIES: Oxygen Saturation is 96% on room air, adequate by my interpretation.    COORDINATION OF CARE: 15:29--I evaluated the patient and we discussed a treatment plan including forearm x-ray, a sling, and follow up with a hand surgeon to which the pt agreed.   Dg Forearm Right  01/15/2012  *RADIOLOGY REPORT*  Clinical Data: History of pain.  RIGHT FOREARM - 2 VIEW  Comparison: 01/05/2012.  Findings: Alignment is normal.  Joint spaces are preserved.  No fracture or dislocation is evident.  No soft tissue lesions are seen.  IMPRESSION: No abnormality is demonstrated.    Original Report Authenticated By: Onalee Hua Call    No diagnosis found.  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  Has wrist splint at home.  Will provide sling.  Patient to follow-up with hand specialist--referral information provided.  MDM   I personally performed the services described in this documentation, which was scribed in my presence. The recorded information has been reviewed and is accurate.        Jimmye Norman, NP 01/15/12 [phone]

## 2012-01-15 NOTE — ED Notes (Signed)
Pt c/o right arm pain. States "my hand is paralyzed but i can move it down a bit." "they gave me vicodin 5s and it helped a lot but I ran out 2 days ago."

## 2012-01-16 NOTE — ED Provider Notes (Signed)
Medical screening examination/treatment/procedure(s) were performed by non-physician practitioner and as supervising physician I was immediately available for consultation/collaboration.  Gerhard Munch, MD 01/16/12 0001

## 2013-07-04 IMAGING — CT CT HEAD W/O CM
5 series · 18 of 47 positions shown, 19 images · non-contrast
Comparison: 06/29/2010

CT HEAD

CLINICAL DATA: Status post fall.  Hit head.

CT HEAD WITHOUT CONTRAST
CT CERVICAL SPINE WITHOUT CONTRAST
TECHNIQUE: Multidetector CT imaging of the head and cervical spine
was performed following the standard protocol without intravenous
contrast.  Multiplanar CT image reconstructions of the cervical
spine were also generated.

[Series 3: head trauma 4.8 h37s · axial · 0.45mm/px · z∈[+1381,+1429]mm · 2 of 30 slices shown, 3 images]
[im 10/30  brain]
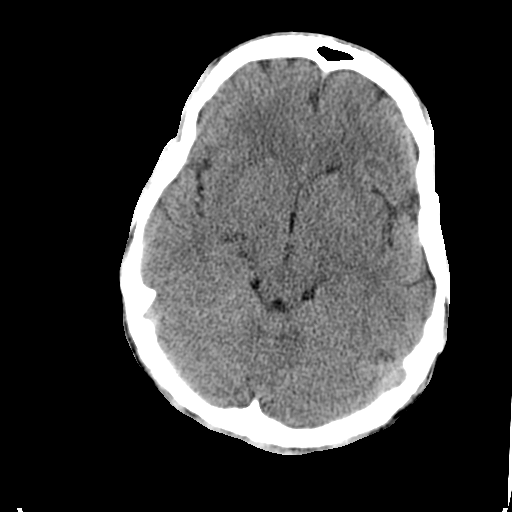
[im 10/30  bone]
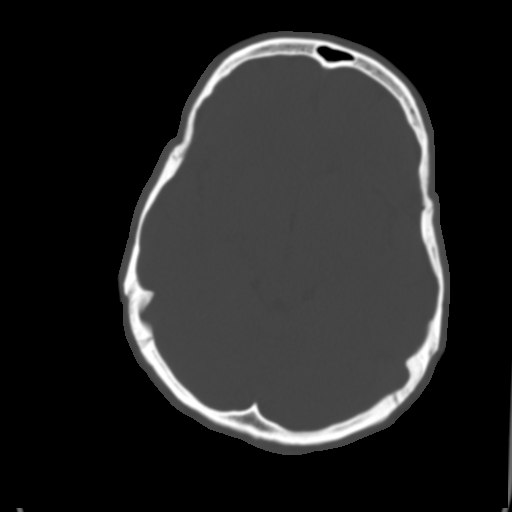
[im 20/30  brain]
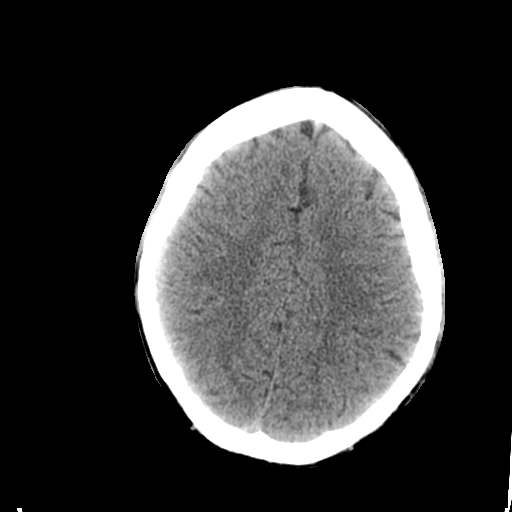

[Series 6: c_spine 2.0 b31s detail · axial · 0.31mm/px · z∈[+1161,+1189]mm · 2 of 96 slices shown]
[im 7/96  bone]
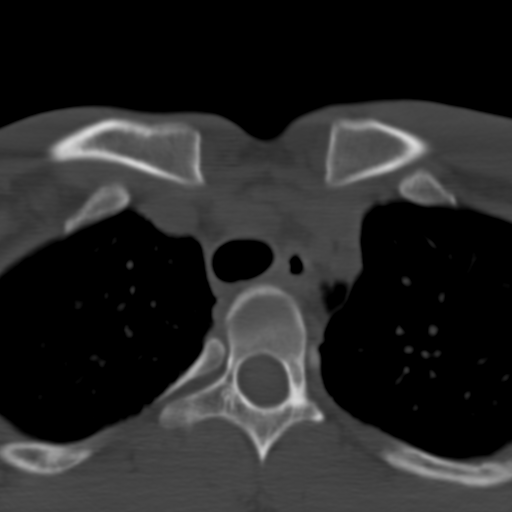
[im 21/96  bone]
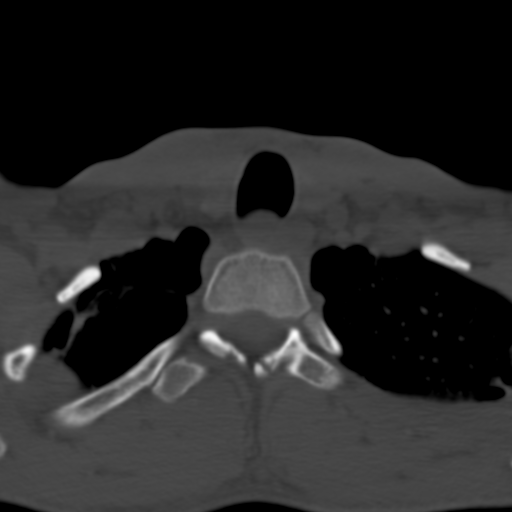

[Series 602: orthog · axial · 0.37mm/px · z∈[+1175,+1278]mm · 8 of 71 slices shown]
[im 8/71  brain]
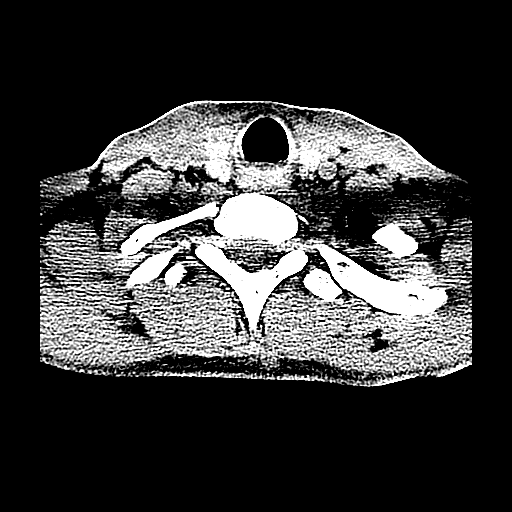
[im 15/71  brain]
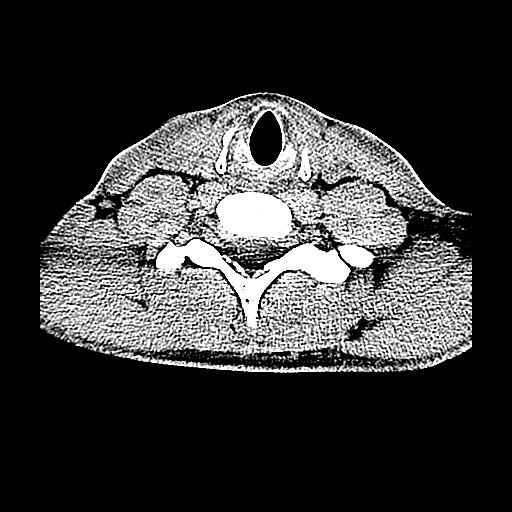
[im 22/71  brain]
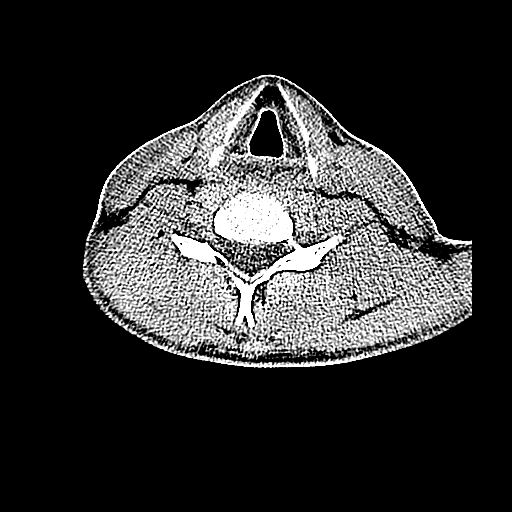
[im 29/71  brain]
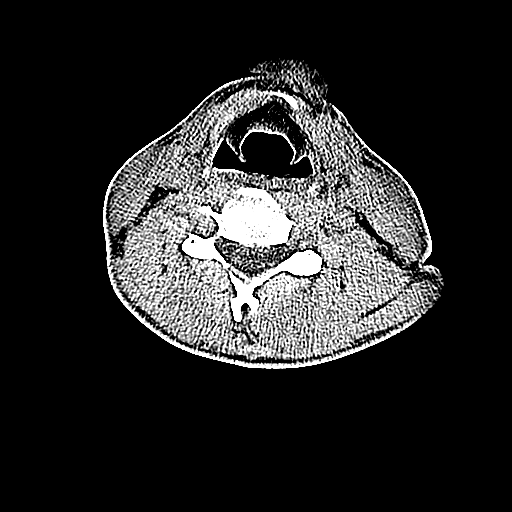
[im 43/71  brain]
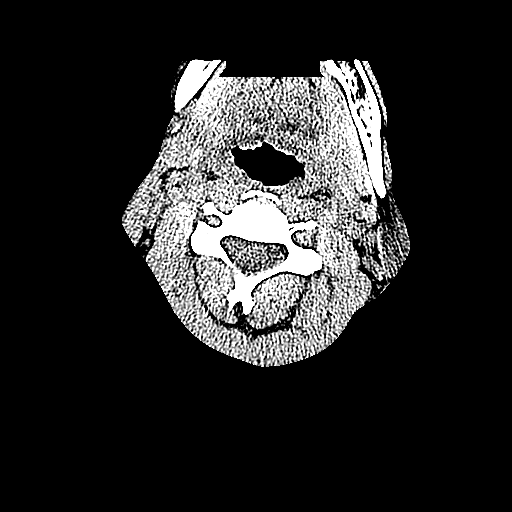
[im 50/71  brain]
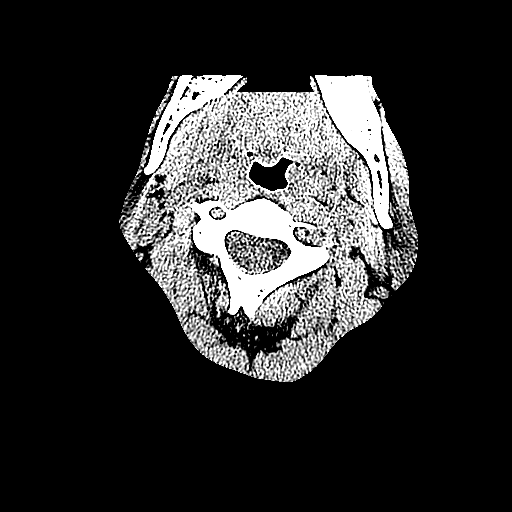
[im 57/71  brain]
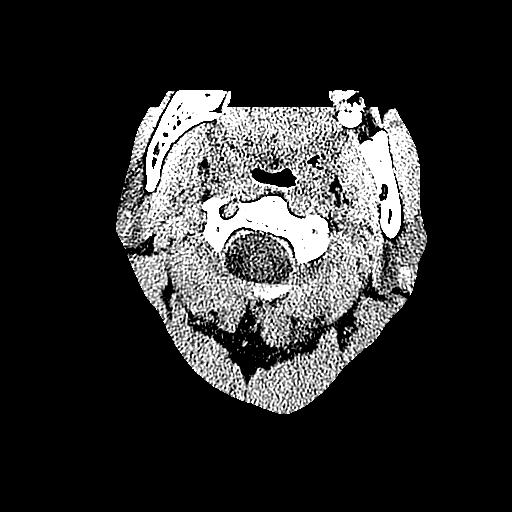
[im 64/71  brain]
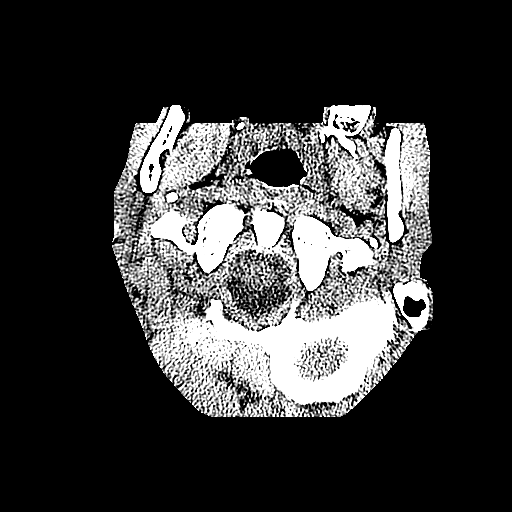

[Series 603: cor · coronal · 0.37mm/px · 3 of 40 slices shown]
[im 14/40  brain]
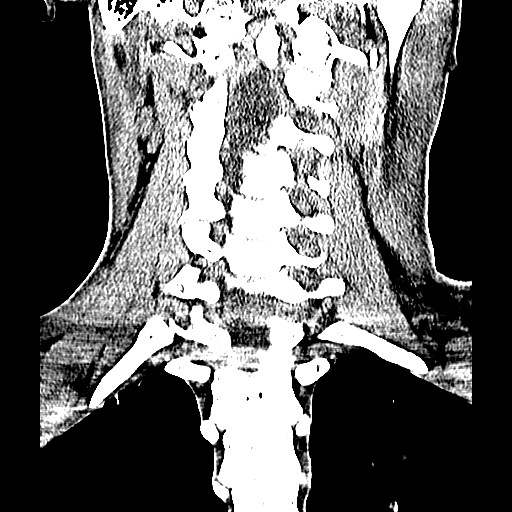
[im 18/40  brain]
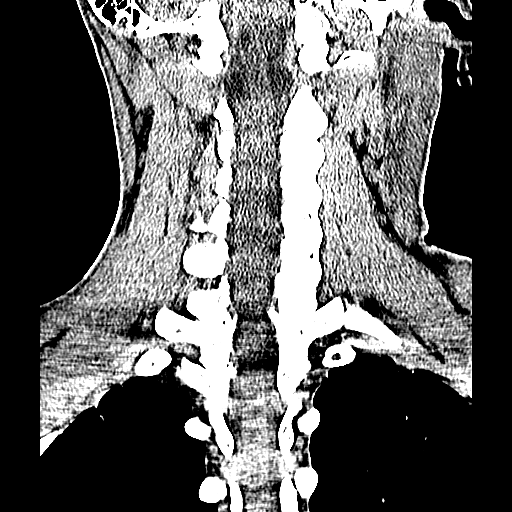
[im 22/40  brain]
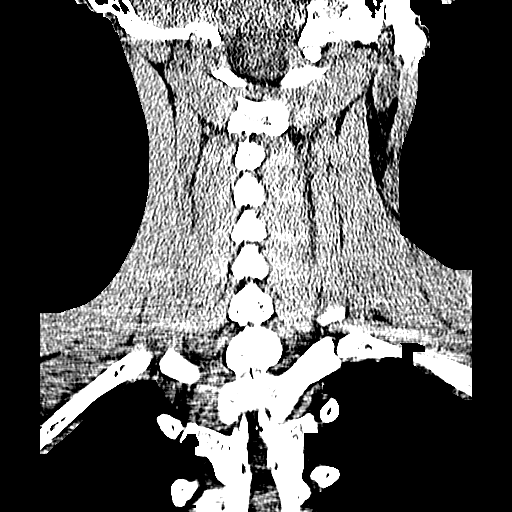

[Series 604: sag · sagittal · 0.37mm/px · 3 of 36 slices shown]
[im 12/36  brain]
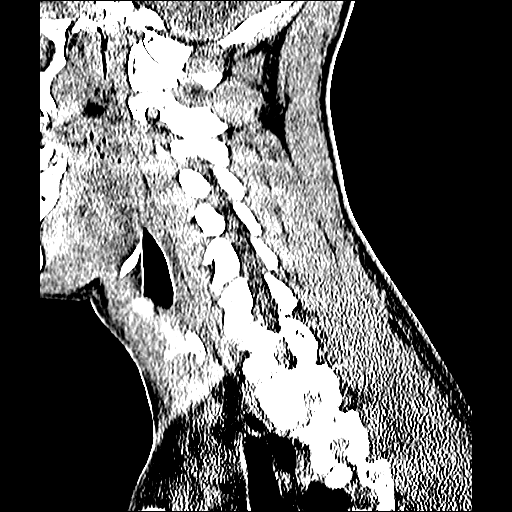
[im 18/36  brain]
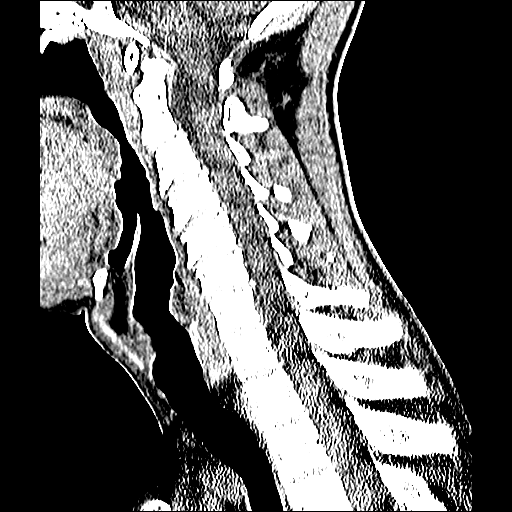
[im 24/36  brain]
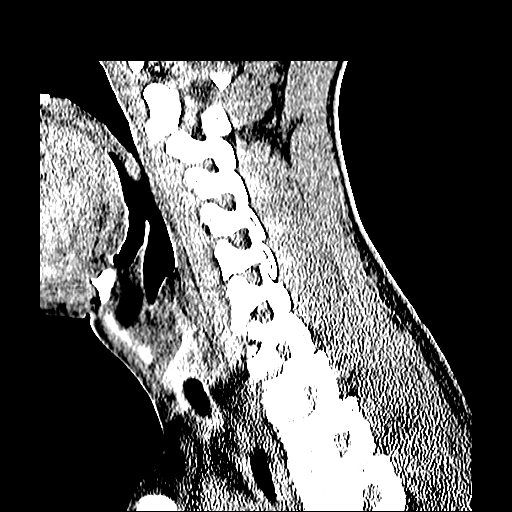

[18 of 47 positions shown; findings below may reference images not displayed]

FINDINGS: The brain has a normal appearance without evidence for
hemorrhage, infarction, hydrocephalus, or mass lesion.  There is no
extra axial fluid collection.  The skull and paranasal sinuses are
normal.
IMPRESSION: 1.  No acute intracranial abnormalities.

CT CERVICAL SPINE
FINDINGS: There is no evidence of cervical spine fracture.
Alignment is normal.  Intervertebral disc spaces are maintained.
IMPRESSION: Negative exam.

## 2013-11-27 ENCOUNTER — Emergency Department (HOSPITAL_COMMUNITY)
Admission: EM | Admit: 2013-11-27 | Discharge: 2013-11-27 | Disposition: A | Discharge status: Home / Self Care | Payer: Self-pay | Attending: Emergency Medicine | Admitting: Emergency Medicine

## 2013-11-27 ENCOUNTER — Encounter (HOSPITAL_COMMUNITY): Payer: Self-pay | Admitting: Family Medicine

## 2013-11-27 DIAGNOSIS — Z79899 Other long term (current) drug therapy: Secondary | ICD-10-CM | POA: Insufficient documentation

## 2013-11-27 DIAGNOSIS — B9789 Other viral agents as the cause of diseases classified elsewhere: Secondary | ICD-10-CM

## 2013-11-27 DIAGNOSIS — Z720 Tobacco use: Secondary | ICD-10-CM | POA: Insufficient documentation

## 2013-11-27 DIAGNOSIS — J069 Acute upper respiratory infection, unspecified: Secondary | ICD-10-CM | POA: Insufficient documentation

## 2013-11-27 DIAGNOSIS — R110 Nausea: Secondary | ICD-10-CM | POA: Insufficient documentation

## 2013-11-27 LAB — RAPID STREP SCREEN (MED CTR MEBANE ONLY): Streptococcus, Group A Screen (Direct): NEGATIVE

## 2013-11-27 MED ORDER — LIDOCAINE VISCOUS 2 % MT SOLN
15.0000 mL | Freq: Once | OROMUCOSAL | Status: AC
  Administered 2013-11-27: 15 mL via OROMUCOSAL
  Filled 2013-11-27: qty 15

## 2013-11-27 MED ORDER — FLUTICASONE PROPIONATE 50 MCG/ACT NA SUSP: 2.0000 | Freq: Every day | NASAL | Status: DC

## 2013-11-27 MED ORDER — BENZONATATE 100 MG PO CAPS: 100.0000 mg | ORAL_CAPSULE | Freq: Three times a day (TID) | ORAL | Status: DC

## 2013-11-27 MED ORDER — CETIRIZINE HCL 10 MG PO CAPS: 10.0000 mg | ORAL_CAPSULE | Freq: Every day | ORAL | Status: DC

## 2013-11-27 MED ORDER — LIDOCAINE VISCOUS 2 % MT SOLN: 20.0000 mL | OROMUCOSAL | Status: DC | PRN

## 2013-11-27 MED ORDER — NAPROXEN 250 MG PO TABS
500.0000 mg | ORAL_TABLET | Freq: Once | ORAL | Status: AC
  Administered 2013-11-27: 500 mg via ORAL
  Filled 2013-11-27: qty 2

## 2013-11-27 NOTE — ED Provider Notes (Signed)
CSN: [id number]     Arrival date & time 11/27/13  0940 History  This chart was scribed for non-physician practitioner, Terri Piedraourtney Forcucci, PA-C,working with Gwyneth SproutWhitney Plunkett, MD, by Karle PlumberJennifer Tensley, ED Scribe. This patient was seen in room TR11C/TR11C and the patient's care was started at 11:02 AM.  Chief Complaint  Patient presents with  . Sore Throat   Patient is a 28 y.o. male presenting with pharyngitis. The history is provided by the patient. No language interpreter was used.  Sore Throat Associated symptoms include shortness of breath. Pertinent negatives include no chest pain and no abdominal pain.    HPI Comments:  Brendan Sutton is a 28 y.o. male who presents to the Emergency Department complaining of severe sore throat that started about five days ago. He reports associated sinus pressure, productive cough of green mucus, green rhinorrhea and congestion, fever Tmax 100 degrees, SOB, wheezing and nausea. He reports taking Ibuprofen, Tylenol and OTC cold medicine with minimal relief of the symptoms. Denies modifying factors. Denies abdominal pain, vomiting, otalgia, inability to swallow or CP.  Does not have a PCP.  History reviewed. No pertinent past medical history. History reviewed. No pertinent past surgical history. History reviewed. No pertinent family history. History  Substance Use Topics  . Smoking status: Current Every Day Smoker -- 0.50 packs/day  . Smokeless tobacco: Not on file  . Alcohol Use: Yes    Review of Systems  Constitutional: Positive for fever and chills.  HENT: Positive for congestion, rhinorrhea, sinus pressure and sore throat. Negative for ear pain and trouble swallowing.   Respiratory: Positive for shortness of breath and wheezing.   Cardiovascular: Negative for chest pain.  Gastrointestinal: Positive for nausea. Negative for vomiting and abdominal pain.  All other systems reviewed and are negative.   Allergies  Review of patient's  allergies indicates no known allergies.  Home Medications   Prior to Admission medications   Medication Sig Start Date End Date Taking? Authorizing Provider  GuaiFENesin (ROBITUSSIN CHEST CONGESTION PO) Take 10 mLs by mouth daily as needed (for cough).   Yes Historical Provider, MD  benzonatate (TESSALON) 100 MG capsule Take 1 capsule (100 mg total) by mouth every 8 (eight) hours. 11/27/13   Lenwood Balsam A Forcucci, PA-C  Cetirizine HCl (ZYRTEC ALLERGY) 10 MG CAPS Take 1 capsule (10 mg total) by mouth at bedtime. 11/27/13   Aleesha Ringstad A Forcucci, PA-C  fluticasone (FLONASE) 50 MCG/ACT nasal spray Place 2 sprays into both nostrils daily. 11/27/13   Everett Ricciardelli A Forcucci, PA-C  lidocaine (XYLOCAINE) 2 % solution Use as directed 20 mLs in the mouth or throat as needed for mouth pain. 11/27/13   Selene Peltzer A Forcucci, PA-C   Triage Vitals: BP 117/83 mmHg  Pulse 75  Temp(Src) 97.7 F (36.5 C)  Resp 18  Ht 5\' 7"  (1.702 m)  Wt 135 lb (61.236 kg)  BMI 21.14 kg/m2  SpO2 98% Physical Exam  Constitutional: He is oriented to person, place, and time. He appears well-developed and well-nourished.  HENT:  Head: Normocephalic and atraumatic.  Right Ear: Tympanic membrane normal.  Left Ear: Tympanic membrane normal.  Nose: Mucosal edema present. Right sinus exhibits maxillary sinus tenderness. Right sinus exhibits no frontal sinus tenderness. Left sinus exhibits maxillary sinus tenderness. Left sinus exhibits no frontal sinus tenderness.  Mouth/Throat: Uvula is midline and mucous membranes are normal. Posterior oropharyngeal edema and posterior oropharyngeal erythema present. No oropharyngeal exudate or tonsillar abscesses.  Eyes: EOM are normal. Pupils are equal, round, and reactive  to light.  Neck: Normal range of motion.  Cardiovascular: Normal rate, regular rhythm and normal heart sounds.  Exam reveals no gallop and no friction rub.   No murmur heard. Pulmonary/Chest: Effort normal and breath sounds  normal. No respiratory distress. He has no wheezes. He has no rales.  Musculoskeletal: Normal range of motion.  Lymphadenopathy:    He has no cervical adenopathy.  Neurological: He is alert and oriented to person, place, and time.  Skin: Skin is warm and dry.  Psychiatric: He has a normal mood and affect. His behavior is normal.  Nursing note and vitals reviewed.   ED Course  Procedures (including critical care time) DIAGNOSTIC STUDIES: Oxygen Saturation is 98% on RA, normal by my interpretation.   COORDINATION OF CARE: 11:08 AM- Will prescribe Zyrtec, Flonase nasal spray, viscous lidocaine and Tessalon. Pt verbalizes understanding and agrees to plan.  Medications - No data to display  Labs Review Labs Reviewed  RAPID STREP SCREEN  CULTURE, GROUP A STREP    Imaging Review No results found.   EKG Interpretation None      MDM   Final diagnoses:  Viral URI with cough   patient is a 28 year old male who presents to the emergency room for sinus pressure, sore throat, cough. Physical exam reveals an alert and nontoxic-appearing male with no exudate in his throat, no cervical adenopathy, clear lung sounds, and no airway compromise. Rapid strep test is negative. Vital signs are stable. Suspect that this is likely viral URI. We will treat symptomatically as seen above. Patient to return for signs of pneumonia. Patient states understanding and agreement. Patient is stable for discharge at this time.  I personally performed the services described in this documentation, which was scribed in my presence. The recorded information has been reviewed and is accurate.    Eben Burowourtney A Forcucci, PA-C 11/27/13 1114  Gwyneth SproutWhitney Plunkett, MD 11/27/13 1501

## 2013-11-27 NOTE — Discharge Instructions (Signed)
Upper Respiratory Infection, Adult °An upper respiratory infection (URI) is also sometimes known as the common cold. The upper respiratory tract includes the nose, sinuses, throat, trachea, and bronchi. Bronchi are the airways leading to the lungs. Most people improve within 1 week, but symptoms can last up to 2 weeks. A residual cough may last even longer.  °CAUSES °Many different viruses can infect the tissues lining the upper respiratory tract. The tissues become irritated and inflamed and often become very moist. Mucus production is also common. A cold is contagious. You can easily spread the virus to others by oral contact. This includes kissing, sharing a glass, coughing, or sneezing. Touching your mouth or nose and then touching a surface, which is then touched by another person, can also spread the virus. °SYMPTOMS  °Symptoms typically develop 1 to 3 days after you come in contact with a cold virus. Symptoms vary from person to person. They may include: °· Runny nose. °· Sneezing. °· Nasal congestion. °· Sinus irritation. °· Sore throat. °· Loss of voice (laryngitis). °· Cough. °· Fatigue. °· Muscle aches. °· Loss of appetite. °· Headache. °· Low-grade fever. °DIAGNOSIS  °You might diagnose your own cold based on familiar symptoms, since most people get a cold 2 to 3 times a year. Your caregiver can confirm this based on your exam. Most importantly, your caregiver can check that your symptoms are not due to another disease such as strep throat, sinusitis, pneumonia, asthma, or epiglottitis. Blood tests, throat tests, and X-rays are not necessary to diagnose a common cold, but they may sometimes be helpful in excluding other more serious diseases. Your caregiver will decide if any further tests are required. °RISKS AND COMPLICATIONS  °You may be at risk for a more severe case of the common cold if you smoke cigarettes, have chronic heart disease (such as heart failure) or lung disease (such as asthma), or if  you have a weakened immune system. The very young and very old are also at risk for more serious infections. Bacterial sinusitis, middle ear infections, and bacterial pneumonia can complicate the common cold. The common cold can worsen asthma and chronic obstructive pulmonary disease (COPD). Sometimes, these complications can require emergency medical care and may be life-threatening. °PREVENTION  °The best way to protect against getting a cold is to practice good hygiene. Avoid oral or hand contact with people with cold symptoms. Wash your hands often if contact occurs. There is no clear evidence that vitamin C, vitamin E, echinacea, or exercise reduces the chance of developing a cold. However, it is always recommended to get plenty of rest and practice good nutrition. °TREATMENT  °Treatment is directed at relieving symptoms. There is no cure. Antibiotics are not effective, because the infection is caused by a virus, not by bacteria. Treatment may include: °· Increased fluid intake. Sports drinks offer valuable electrolytes, sugars, and fluids. °· Breathing heated mist or steam (vaporizer or shower). °· Eating chicken soup or other clear broths, and maintaining good nutrition. °· Getting plenty of rest. °· Using gargles or lozenges for comfort. °· Controlling fevers with ibuprofen or acetaminophen as directed by your caregiver. °· Increasing usage of your inhaler if you have asthma. °Zinc gel and zinc lozenges, taken in the first 24 hours of the common cold, can shorten the duration and lessen the severity of symptoms. Pain medicines may help with fever, muscle aches, and throat pain. A variety of non-prescription medicines are available to treat congestion and runny nose. Your caregiver   can make recommendations and may suggest nasal or lung inhalers for other symptoms.  °HOME CARE INSTRUCTIONS  °· Only take over-the-counter or prescription medicines for pain, discomfort, or fever as directed by your  caregiver. °· Use a warm mist humidifier or inhale steam from a shower to increase air moisture. This may keep secretions moist and make it easier to breathe. °· Drink enough water and fluids to keep your urine clear or pale yellow. °· Rest as needed. °· Return to work when your temperature has returned to normal or as your caregiver advises. You may need to stay home longer to avoid infecting others. You can also use a face mask and careful hand washing to prevent spread of the virus. °SEEK MEDICAL CARE IF:  °· After the first few days, you feel you are getting worse rather than better. °· You need your caregiver's advice about medicines to control symptoms. °· You develop chills, worsening shortness of breath, or brown or red sputum. These may be signs of pneumonia. °· You develop yellow or brown nasal discharge or pain in the face, especially when you bend forward. These may be signs of sinusitis. °· You develop a fever, swollen neck glands, pain with swallowing, or white areas in the back of your throat. These may be signs of strep throat. °SEEK IMMEDIATE MEDICAL CARE IF:  °· You have a fever. °· You develop severe or persistent headache, ear pain, sinus pain, or chest pain. °· You develop wheezing, a prolonged cough, cough up blood, or have a change in your usual mucus (if you have chronic lung disease). °· You develop sore muscles or a stiff neck. °Document Released: 06/20/2000 Document Revised: 03/19/2011 Document Reviewed: 04/01/2013 °ExitCare® Patient Information ©2015 [employer]. This information is not intended to replace advice given to you by your health care provider. Make sure you discuss any questions you have with your health care provider. ° °Emergency Department Resource Guide °1) Find a Doctor and Pay Out of Pocket °Although you won't have to find out who is covered by your insurance plan, it is a good idea to ask around and get recommendations. You will then need to call the office and see if  the doctor you have chosen will accept you as a new patient and what types of options they offer for patients who are self-pay. Some doctors offer discounts or will set up payment plans for their patients who do not have insurance, but you will need to ask so you aren't surprised when you get to your appointment. ° °2) Contact Your Local Health Department °Not all health departments have doctors that can see patients for sick visits, but many do, so it is worth a call to see if yours does. If you don't know where your local health department is, you can check in your phone book. The CDC also has a tool to help you locate your state's health department, and many state websites also have listings of all of their local health departments. ° °3) Find a Walk-in Clinic °If your illness is not likely to be very severe or complicated, you may want to try a walk in clinic. These are popping up all over the country in pharmacies, drugstores, and shopping centers. They're usually staffed by nurse practitioners or physician assistants that have been trained to treat common illnesses and complaints. They're usually fairly quick and inexpensive. However, if you have serious medical issues or chronic medical problems, these are probably not your best option. ° °  No Primary Care Doctor: °- Call [facility] at  832-8000 - they can help you locate a primary care doctor that  accepts your insurance, provides certain services, etc. °- Physician Referral Service- 1-800-533-3463 ° °Chronic Pain Problems: °Organization         Address  Phone   Notes  °[facility]  (336) 297-2271 Patients need to be referred by their primary care doctor.  ° °Medication Assistance: °Organization         Address  Phone   Notes  °[place] [facility] [address] °[place], [place] [address] (336) 641-8030 --Must be a resident of [place] °-- Must have NO insurance coverage whatsoever (no  Medicaid/ Medicare, etc.) °-- The pt. MUST have a primary care doctor that directs their care regularly and follows them in the community °  °[facility]  (866) 331-1348   °[employer]  (888) 892-1162   ° °Agencies that provide inexpensive medical care: °Organization         Address  Phone   Notes  °[facility]  (336) 832-8035   °[facility]    (336) 832-7272   °[facility] [address] °[place], [place] [address] (336) 832-4777   °[facility] [address], °[place] (336) 271-4999   °[facility]    (336) 373-0678   °[facility]    (336) 272-1050   °[facility] ° [address], [place] Phone:  (336) 832-4444, Fax:  (336) 832-4440 Hours of Operation:  9 am - 6 pm, M-F.  Also accepts Medicaid/Medicare and self-pay.  °[facility] ° [address], [place] Phone: (336) 832-3150, Fax: (336) [phone]. Hours of Operation:  8:30 am - 5:30 pm, M-F.  Also accepts Medicaid and self-pay.  °[facility] [place] [address], [place] Phone: (336) 878-6027   °[facility] [address], [place], [place] (336)723-1848, Ext. 123 Mondays & Thursdays: 7-9 AM.  First 15 patients are seen on a first come, first serve basis. °  ° °Medicaid-accepting [place] Providers: ° °Organization         Address  Phone   Notes  °[facility] [address], [place] (336) 641-2100 Also accepts self-pay patients.  °[facility] [address], [place] ° (336) 856-9996   °[facility] [address], [place] (336) 288-8857   °[facility] [address] [place] [address], [place] (336) 299-7000   °Veita Bland [address], [place]  ° (336) 373-1557 Only accepts [place] Access Medicaid patients after they have their name applied to their card.   ° °Self-Pay (no insurance) in [place]: ° °Organization         Address  Phone   Notes  °Sickle Cell Patients, [facility] [address], [place] (336) 832-1970   °[facility] [address], [place] (336) 832-4400   °[facility] ° [address], [place] (336) 992-4800   °[facility]/Dr. Osei-Bonsu ° [address] [place] [address], [place] or [address], [place] (336) 841-8500 Phone number for both [place] and [place] locations is the same.  °[facility] [address], [place] (336) 299-0000   °  [facility] [address] [place] [address], [place] or [address] (336) 852-7530 °(336) 878-2260   °[facility] [address], [place] (336) 350-1642, phone; (336) 294-5005, fax Sees patients 1st and 3rd Saturday of every month.  Must not qualify for public or private insurance (i.e. Medicaid, Medicare, [place] Health Choice, Veterans' Benefits) • Household income should be no more than 200% of the poverty level •The clinic cannot treat you if you are pregnant or think you are pregnant • Sexually transmitted diseases are not treated at the clinic.  ° ° °Dental Care: °Organization         Address  Phone  Notes  °[facility] [address], [place] (336) 641-6152 Accepts children up to age 21 who are enrolled in Medicaid or [place] Health Choice; pregnant women with a Medicaid card; and children who have applied for Medicaid or [place] Health Choice, but were declined, whose parents can pay a reduced fee at time of service.  °[facility] [place]  [address], [place] (336) 641-7733 Accepts children up to age 21 who are enrolled in Medicaid or [employer] Health Choice; pregnant women with a Medicaid card; and children who have applied for Medicaid or [place] Health Choice,  but were declined, whose parents can pay a reduced fee at time of service.  °[facility] ° [address], [place] (336) 641-4533 Patients are seen by appointment only. Walk-ins are not accepted. [facility] will see patients 18 years of age and older. °Monday - Tuesday (8am-5pm) °Most Wednesdays (8:30-5pm) °$30 per visit, cash only  °[facility] ° [address], [place] (336) 641-4533 Patients are seen by appointment only. Walk-ins are not accepted. [facility] will see patients 18 years of age and older. °One Wednesday Evening (Monthly: Volunteer Based).  $30 per visit, cash only  °[facility]  (919) 537-3737 for adults; Children under age 4, call Graduate Pediatric Dentistry at ([phone]) [phone]. Children aged 4-14, please call ([phone]) [phone] to request a pediatric application. ° Dental services are provided in all areas of dental care including fillings, crowns and bridges, complete and partial dentures, implants, gum treatment, root canals, and extractions. Preventive care is also provided. Treatment is provided to both adults and children. °Patients are selected via a lottery and there is often a waiting list. °  °[facility] [address], °[place] ° (336) 763-8833 [email or web address] °  °[facility] [address], [place], [place] (336)723-1848, Ext. 123 Second and Fourth Thursday of each month, opens at 6:30 AM; Clinic ends at 9 AM.  Patients are seen on a first-come first-served basis, and a limited number are seen during each clinic.  ° °[facility] ° [address], [place], [place] (336) 723-7904   Eligibility Requirements °You must have lived in [place], [place], or [place] counties for at least the last three months. °  You cannot be eligible for state or federal sponsored healthcare insurance, including Veterans Administration, Medicaid, or Medicare. °  You generally  cannot be eligible for healthcare insurance through your employer.  °  How to apply: °Eligibility screenings are held every Tuesday and Wednesday afternoon from 1:00 pm until 4:00 pm. You do not need an appointment for the interview!  °[facility] [address], [place], [place] 336-631-2330   °  [facility] [facility]  336-342-8273   °[facility]  336-703-3100   °[facility]  336-570-6415   ° °Behavioral Health Resources in the Community: °Intensive Outpatient Programs °Organization         Address  Phone  Notes  °[facility] [address], [place], [place] 336-878-6098   °[facility] [address], [place], [place] 336-832-9800   °[facility] [address], [place], [place] ° 336-882-2125   °[facility] [address],  °[place], [place] 1-800-853-5163 or 336-641-4981   °Substance Abuse Resources °Organization         Address  Phone  Notes  °[facility]  336-882-2125   °[facility]  336-784-9470   °[facility]  336-285-9073   °[facility]  336-845-3988   °Residential & Outpatient Substance Abuse Program  1-800-659-3381   °Psychological Services °Organization         Address  Phone  Notes  °[facility]  336- 832-9600   °[facility]  336- 378-7881   °[facility] [address], [place] 1-800-853-5163 or 336-641-4981   ° °Mobile Crisis Teams °Organization         Address  Phone  Notes  °[facility], Mobile Crisis Care Unit  1-877-626-1772   °Assertive °[facility] ° [address] [place], [place] 336-834-9664   °Sharon DeEsch [address] °[place] 336-554-5454   ° °Self-Help/Support Groups °Organization         Address  Phone             Notes  °[facility] - variety of support groups  336- 373-1402 Call for more  information  °Narcotics Anonymous (NA), [facility] [address], °[place]  2 meetings at this location  ° °Residential Treatment Programs °Organization         Address  Phone  Notes  °[facility] [address],    °[place]  1-866-801-8205   °[facility] ° [address], [place], [place] 704-293-8524   °[facility] [address], [place] 336-845-3988 Admissions: 8am-3pm M-F  °[facility] [address]    °[place], [place] 336-841-1104   °[facility] [address], [place], [place] 336-379-7146   °[facility] [address]  °[place], [place] 336-285-9073   °[facility] - Intensive Outpatient [address], [place], [place] 336-852-3033   °[facility] [address]  °[place], [place] 1-877-615-2722 or 336-784-9470   °[facility] [address] [place], [place] 336-227-7417 Accepts Medicaid  °[facility] [address]  °[place] 1-800-659-3381 Substance Abuse/Addiction Treatment  ° °[place] Behavioral Health Resources °Organization         Address  Phone  Notes  °[facility]  (888) 581-9988   °Julie Brannon, PhD [address] [place], [place]   (336) 349-5553 or (336) 951-0000   °[facility]   [address] °[place], [place] (336) 349-4454   °[facility] [address], [place], [place] (336) 342-8316 Insurance/Medicaid/sponsorship through [facility]  °[facility] [address]                                      [place], [place] (336) 342-8316 Therapy/tele-psych/case  °[facility] [address]  ° [place], [place] (336) 349-2233    °Dr. Arfeen  (336) 349-4544   °[facility] [place]  [employer] [place] [facility] 1) [address],  °2) [address], [place] °3)  [address], [place] (336)  349-3220 °(336) 342-7768 ° °(336) 342-8140   °[place] [facility] (336) 342-1394 or (336) 342-3537 (After Hours)    ° ° °

## 2013-11-27 NOTE — ED Notes (Signed)
Started 4 days ago with sore throat and cough.

## 2013-11-27 NOTE — ED Notes (Signed)
Per pt sts 3 days of URI symptoms. sts sore throat, sinus pressure, runny nose

## 2013-11-29 LAB — CULTURE, GROUP A STREP

## 2014-01-19 ENCOUNTER — Encounter (HOSPITAL_COMMUNITY): Payer: Self-pay | Admitting: *Deleted

## 2014-01-19 ENCOUNTER — Emergency Department (HOSPITAL_COMMUNITY)
Admission: EM | Admit: 2014-01-19 | Discharge: 2014-01-19 | Disposition: A | Discharge status: Home / Self Care | Payer: Self-pay | Attending: Emergency Medicine | Admitting: Emergency Medicine

## 2014-01-19 DIAGNOSIS — Z720 Tobacco use: Secondary | ICD-10-CM | POA: Insufficient documentation

## 2014-01-19 DIAGNOSIS — Z79899 Other long term (current) drug therapy: Secondary | ICD-10-CM | POA: Insufficient documentation

## 2014-01-19 DIAGNOSIS — Z7951 Long term (current) use of inhaled steroids: Secondary | ICD-10-CM | POA: Insufficient documentation

## 2014-01-19 DIAGNOSIS — K088 Other specified disorders of teeth and supporting structures: Secondary | ICD-10-CM | POA: Insufficient documentation

## 2014-01-19 DIAGNOSIS — K0889 Other specified disorders of teeth and supporting structures: Secondary | ICD-10-CM

## 2014-01-19 MED ORDER — PENICILLIN V POTASSIUM 500 MG PO TABS: 500.0000 mg | ORAL_TABLET | Freq: Four times a day (QID) | ORAL | Status: AC

## 2014-01-19 MED ORDER — IBUPROFEN 400 MG PO TABS: 400.0000 mg | ORAL_TABLET | Freq: Three times a day (TID) | ORAL | Status: DC | PRN

## 2014-01-19 MED ORDER — HYDROCODONE-ACETAMINOPHEN 5-325 MG PO TABS
1.0000 | ORAL_TABLET | Freq: Four times a day (QID) | ORAL | Status: DC | PRN
Start: 2014-01-19 — End: 2022-06-15

## 2014-01-19 NOTE — ED Notes (Signed)
Lt. Upper wisdom broke in half. Sharp edge. States, "cutting in lt. Lateral, inside of cheek." duration x 2 weeks

## 2014-01-19 NOTE — ED Provider Notes (Signed)
CSN: [id number]     Arrival date & time 01/19/14  1428 History   First MD Initiated Contact with Patient 01/19/14 1511     Chief Complaint  Patient presents with  . Dental Pain     (Consider location/radiation/quality/duration/timing/severity/associated sxs/prior Treatment) The history is provided by the patient. No language interpreter was used.  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 Patient reports that approximately 2 weeks ago his left upper molar cracked while eating. Stated that over the past 2 days the pain is gone progressively worse described as a constant throbbing sensation with radiation to the left side of his face. Reported that he noticed an scant amount of bleeding. Stated that he's been using ibuprofen and Tylenol with minimal relief. Reported that the last time he was seen at a dentist was many years ago. Denied difficulty swallowing, neck pain, neck stiffness, fever, chills, chest pain, shortness of breath, difficulty breathing, voice changes. PCP none  History reviewed. No pertinent past medical history. History reviewed. No pertinent past surgical history. History reviewed. No pertinent family history. History  Substance Use Topics  . Smoking status: Current Every Day Smoker -- 0.50 packs/day  . Smokeless tobacco: Not on file  . Alcohol Use: Yes    Review of Systems  Constitutional: Negative for fever and chills.  HENT: Positive for dental problem. Negative for trouble swallowing.   Eyes: Negative for visual disturbance.  Respiratory: Negative for chest tightness and shortness of breath.   Cardiovascular: Negative for chest pain.  Musculoskeletal: Negative for neck pain and neck stiffness.      Allergies  Review of patient's allergies indicates no known allergies.  Home Medications   Prior to Admission medications   Medication Sig Start Date End Date Taking? Authorizing  Provider  benzonatate (TESSALON) 100 MG capsule Take 1 capsule (100 mg total) by mouth every 8 (eight) hours. 11/27/13   Courtney A Forcucci, PA-C  Cetirizine HCl (ZYRTEC ALLERGY) 10 MG CAPS Take 1 capsule (10 mg total) by mouth at bedtime. 11/27/13   Courtney A Forcucci, PA-C  fluticasone (FLONASE) 50 MCG/ACT nasal spray Place 2 sprays into both nostrils daily. 11/27/13   Courtney A Forcucci, PA-C  GuaiFENesin (ROBITUSSIN CHEST CONGESTION PO) Take 10 mLs by mouth daily as needed (for cough).    Historical Provider, MD  HYDROcodone-acetaminophen (NORCO/VICODIN) 5-325 MG per tablet Take 1 tablet by mouth every 6 (six) hours as needed. 01/19/14   Nissan Frazzini, PA-C  ibuprofen (ADVIL,MOTRIN) 400 MG tablet Take 1 tablet (400 mg total) by mouth every 8 (eight) hours as needed. 01/19/14   Sabin Gibeault, PA-C  lidocaine (XYLOCAINE) 2 % solution Use as directed 20 mLs in the mouth or throat as needed for mouth pain. 11/27/13   Courtney A Forcucci, PA-C  penicillin v potassium (VEETID) 500 MG tablet Take 1 tablet (500 mg total) by mouth 4 (four) times daily. 01/19/14 01/26/14  Jaylinn Hellenbrand, PA-C   BP 116/69 mmHg  Pulse 61  Temp(Src) 98.2 F (36.8 C) (Oral)  Resp 20  SpO2 100% Physical Exam  Constitutional: He is oriented to person, place, and time. He appears well-developed and well-nourished. No distress.  HENT:  Head: Normocephalic and atraumatic.  Mouth/Throat:    Negative facial swelling identified. Negative erythema or changes to skin: Noted. Negative warmth upon palpation Diagrammed left molar of the maxillary jawline with mild swelling identified to the gumline with negative drainage, bleeding, lesions,  sores. Discomfort upon palpation with tongue depressor. Beginnings of decaying process. Negative signs of abscess. Negative trismus. Uvula midline with symmetrical elevation. Negative uvula swelling. Negative sublingual lesions.  Eyes: Conjunctivae and EOM are normal. Pupils are equal,  round, and reactive to light. Right eye exhibits no discharge. Left eye exhibits no discharge.  Neck: Normal range of motion. Neck supple. No tracheal deviation present.  Negative neck stiffness Negative nuchal rigidity Negative cervical lymphadenopathy Negative meningeal signs  Cardiovascular: Normal rate, regular rhythm and normal heart sounds.  Exam reveals no friction rub.   No murmur heard. Pulmonary/Chest: Effort normal and breath sounds normal. No respiratory distress. He has no wheezes. He has no rales.  Patient stable to speak in full sentences without difficulty Negative use of excess her muscles Negative stridor  Musculoskeletal: Normal range of motion.  Lymphadenopathy:    He has no cervical adenopathy.  Neurological: He is alert and oriented to person, place, and time. No cranial nerve deficit. He exhibits normal muscle tone. Coordination normal.  Skin: Skin is warm and dry. No rash noted. He is not diaphoretic. No erythema.  Psychiatric: He has a normal mood and affect. His behavior is normal. Thought content normal.  Nursing note and vitals reviewed.   ED Course  Procedures (including critical care time) Labs Review Labs Reviewed - No data to display  Imaging Review No results found.   EKG Interpretation None      MDM   Final diagnoses:  Pain, dental    Medications - No data to display  Filed Vitals:   01/19/14 1442 01/19/14 1723  BP: 133/83 116/69  Pulse: 87 61  Temp: 98.5 F (36.9 C) 98.2 F (36.8 C)  TempSrc: Oral Oral  Resp: 20 20  SpO2: 100% 100%   Doubt Ludwig's angina. Doubt retropharyngeal abscess. Doubt peritonsillar abscess. Patient presenting to the ED with dental pain secondary to diagrammed in decaying left second molar of the maxillary jawline. Negative drainable abscess noted at this time. Patient stable, afebrile. Patient not septic appearing. Negative sign of respiratory distress. Discharged patient. Charge patient with antibiotics  and pain medications-discussed course, cautions, disposal technique. [facility]. Resource guide given. Discussed with patient to closely monitor symptoms and if symptoms are to worsen or change to report back to the ED - strict return instructions given.  Patient agreed to plan of care, understood, all questions answered.   Raymon Mutton, PA-C 01/19/14 1737  Nelia Shi, MD 01/22/14 [id number]

## 2014-01-19 NOTE — Discharge Instructions (Signed)
Please call your doctor for a followup appointment within 24-48 hours. When you talk to your doctor please let them know that you were seen in the emergency department and have them acquire all of your records so that they can discuss the findings with you and formulate a treatment plan to fully care for your new and ongoing problems. Please call and set-up an appointment with dentist Please rest and stay hydrated Please take antibiotics on a full stomach  Please take medications as prescribed - while on pain medications there is to be no drinking alcohol, driving, operating any heavy machinery. If extra please dispose in a proper manner. Please do not take any extra Tylenol with this medication for this can lead to Tylenol overdose and liver issues.  Please continue to monitor symptoms closely and if symptoms are to worsen or change (fever greater than 101, chills, sweating, nausea, vomiting, chest pain, shortness of breathe, difficulty breathing, weakness, numbness, tingling, worsening or changes to pain pattern, swelling, neck pain, neck stiffness, difficulty swallowing, bleeding and drainage) please report back to the Emergency Department immediately.    Dental Pain A tooth ache may be caused by cavities (tooth decay). Cavities expose the nerve of the tooth to air and hot or cold temperatures. It may come from an infection or abscess (also called a boil or furuncle) around your tooth. It is also often caused by dental caries (tooth decay). This causes the pain you are having. DIAGNOSIS  Your caregiver can diagnose this problem by exam. TREATMENT   If caused by an infection, it may be treated with medications which kill germs (antibiotics) and pain medications as prescribed by your caregiver. Take medications as directed.  Only take over-the-counter or prescription medicines for pain, discomfort, or fever as directed by your caregiver.  Whether the tooth ache today is caused by infection or  dental disease, you should see your dentist as soon as possible for further care. SEEK MEDICAL CARE IF: The exam and treatment you received today has been provided on an emergency basis only. This is not a substitute for complete medical or dental care. If your problem worsens or new problems (symptoms) appear, and you are unable to meet with your dentist, call or return to this location. SEEK IMMEDIATE MEDICAL CARE IF:   You have a fever.  You develop redness and swelling of your face, jaw, or neck.  You are unable to open your mouth.  You have severe pain uncontrolled by pain medicine. MAKE SURE YOU:   Understand these instructions.  Will watch your condition.  Will get help right away if you are not doing well or get worse. Document Released: 12/25/2004 Document Revised: 03/19/2011 Document Reviewed: 08/13/2007 [facility] Patient Information 2015 [place], [place]. This information is not intended to replace advice given to you by your health care provider. Make sure you discuss any questions you have with your health care provider.   Emergency Department Resource Guide 1) Find a Doctor and Pay Out of Pocket Although you won't have to find out who is covered by your insurance plan, it is a good idea to ask around and get recommendations. You will then need to call the office and see if the doctor you have chosen will accept you as a new patient and what types of options they offer for patients who are self-pay. Some doctors offer discounts or will set up payment plans for their patients who do not have insurance, but you will need to ask so  you aren't surprised when you get to your appointment.  2) Contact Your Local Health Department Not all health departments have doctors that can see patients for sick visits, but many do, so it is worth a call to see if yours does. If you don't know where your local health department is, you can check in your phone book. The CDC also has a tool to help  you locate your state's health department, and many state websites also have listings of all of their local health departments.  3) Find a Walk-in Clinic If your illness is not likely to be very severe or complicated, you may want to try a walk in clinic. These are popping up all over the country in pharmacies, drugstores, and shopping centers. They're usually staffed by nurse practitioners or physician assistants that have been trained to treat common illnesses and complaints. They're usually fairly quick and inexpensive. However, if you have serious medical issues or chronic medical problems, these are probably not your best option.  No Primary Care Doctor: - Call Health Connect at  [phone] - they can help you locate a primary care doctor that  accepts your insurance, provides certain services, etc. - Physician Referral Service- [phone]  Chronic Pain Problems: Organization         Address  Phone   Notes  [facility]  [phone] Patients need to be referred by their primary care doctor.   Medication Assistance: Organization         Address  Phone   Notes  [facility] Medication [facility] [address] [place], [place] [address] [phone] --Must be a resident of [facility] -- Must have NO insurance coverage whatsoever (no Medicaid/ Medicare, etc.) -- The pt. MUST have a primary care doctor that directs their care regularly and follows them in the community   Med[facility]ist  [phone]   [employer]  [phone]    Agencies that provide inexpensive medical care: Organization         Address  Phone   Notes  [facility]  [phone]   [facility]    [phone]   [facility] [address] [place], [place] [address] [phone]   [facility] [address] [place], [place] [phone]   Planned Parenthood    [phone]   [facility]    [phone]   [facility]  [address], [place] Phone:  [phone], Fax:  [phone] Hours of Operation:  9 am - 6 pm, M-F.  Also accepts Medicaid/Medicare and self-pay.  [facility]  [address], [place] Phone: [phone], Fax: [phone]. Hours of Operation:  8:30 am - 5:30 pm, M-F.  Also accepts Medicaid and self-pay.  [facility] [place] [address] [place] Phone: [phone]   [facility] [address] Natasha Bence [place], [place] [phone], Ext. [phone] Mondays & Thursdays: 7-9 AM.  First 15 patients are seen on a first come, first serve basis.    Medicaid-accepting [facility] Providers:  Organization         Address  Phone   Notes  [facility] [address], [place] [phone] Also accepts self-pay patients.  [facility] [address] Laurell Josephs [place], [place]  [phone]  [facility] [address], [place] [phone]   [facility] [address] [place] [address], [place] [phone]   Renaye RakersVeita Bland [address], [place]   [phone] Only accepts [place] Access [place] patients after they have their name applied to their card.   Self-Pay (no insurance) in [place]:  Organization         Address  Phone   Notes  Sickle Cell Patients, [facility] [address], [place] [phone]   [facility] [address], [place] [phone]   [facility]  [address], [place] [phone]   [facility]/Dr. Osei-Bonsu  [address] [place] [address], [place] or [phone], [place] [phone] Phone number for both [place] and [place] locations is the same.  [facility] [address],  [place] [phone]   [facility] [address] [place] [address], [place] or [address] [phone]   [facility] [address], [place] [phone], phone; [phone], fax Sees patients 1st and 3rd Saturday of every month.  Must not qualify for public or private insurance (i.e. Medicaid, Medicare, Long Health Choice, Veterans' Benefits)  Household income should be no more than 200% of the poverty level The clinic cannot treat you if you are pregnant or think you are pregnant  Sexually transmitted diseases are not treated at the clinic.    Dental Care: Organization         Address  Phone  Notes  [facility] [address], [place] [phone] Accepts children up to age 30 who are enrolled in [place]icaid or [place] Health Choice; pregnant women with a Medicaid card; and children who have applied for Medicaid or [place] Health Choice, but were declined, whose parents can pay a reduced fee at time of service.  [facility] [place]  [address], [place] [phone] Accepts children up to age 30 who are enrolled in [place]icaid or [place] Health Choice; pregnant women with a Medicaid card; and children who have applied for Medicaid or [place] Health Choice, but were declined, whose parents can pay a reduced fee at time of service.  [facility]  [address], [place] [phone] Patients are seen by appointment only. Walk-ins are not accepted. [facility] will see patients 30 years of age and older. Monday - Tuesday (8am-5pm) Most Wednesdays (8:30-5pm) $30 per visit, cash only  [facility]  [address], [facility] [phone] Patients are seen by appointment only. Walk-ins are not accepted. [facility] will see patients 30 years of age and older. One Wednesday Evening  (Monthly: Volunteer Based).  $30 per visit, cash only  [employer] [facility]  [phone] for adults; Children under age 584, call Graduate Pediatric Dentistry at [phone]. Children aged 224-14, please call [phone] to request a pediatric application.  Dental services are provided in all areas of dental care including fillings, crowns and bridges, complete and partial dentures, implants, gum treatment, root canals, and extractions. Preventive care is also provided. Treatment is provided to both adults and children. Patients are selected via a lottery and there is often a waiting list.  [facility] [address], [place]  [phone] [email or web address]   [facility] [address], [place], [place] [phone], Ext. [phone] Second and Fourth Thursday of each month, opens at 6:30 AM; Clinic ends at 9 AM.  Patients are seen on a first-come first-served basis, and a limited number are seen during each clinic.   [facility]  [address], [place], [place] [phone]   Eligibility Requirements You must have lived in [place], [place], or [place] counties for at least the last three months.   You cannot be eligible for state or federal sponsored National Cityhealthcare insurance, including CIGNAVeterans Administration, [place]icaid, or [employer] EntertainmentMedicare.   You generally cannot be eligible for healthcare insurance through your employer.    How to apply: Eligibility screenings are held every Tuesday and Wednesday afternoon from 1:00 pm until 4:00 pm. You do not need an appointment for the interview!  [facility] [address], [place], [place] [phone]   [facility]  [phone]   [facility] [place] [facility]  [phone]   [facility]  [phone]    Behavioral Health Resources in the Community: Intensive Outpatient Programs Organization         Address  Phone  Notes  [facility]  [facility]al Health Services [address], [place], [place] [phone]   [facility] [address], [place], [place] [phone]   [facility] [address], [place], [place]  [phone]   [facility] [address],  [place], [place] [phone] or [phone]   Substance Abuse Resources Organization         Address  Phone  Notes  [facility]  [phone]   [facility]  [phone]   [facility]  [phone]   [facility]  [phone]   Residential & Outpatient Substance Abuse Program  [phone]   Psychological Services Organization         Address  Phone  Notes  [facility]  [phone]- [phone]   [facility]  [phone]- [phone]   [facility] [address], [place] [phone] or [phone]    Mobile Crisis Teams Organization         Address  Phone  Notes  [facility], Mobile Crisis Care Unit  [phone]   [facility]  [address] [place], [place] [phone]   Doristine LocksSharon DeEsch [address] [place] [phone]    Self-Help/Support Groups Organization         Address  Phone             Notes  [facility] [place] - variety of support groups  [phone] Call for more information  Narcotics Anonymous (NA), Caring [facility] [address], [place]  2 meetings at this location   Statisticianesidential Treatment Programs Organization         Address  Phone  Notes  [facility] [address],    [place]  [phone]   [facility]  [address], [place] [phone], [place], [place] [phone]   [facility] [address], [place] [phone] Admissions: 8am-3pm M-F  [facility] [address]    [place], [place] [phone]   [facility] [address],  [place], [place] [phone]   [facility] [address]  Barbara Cower  [place], [place]   [facility] - Intensive Outpatient [address]een [address], [place], [place]   [facility] [place].,  [place], [place] [phone] or [phone]   [facility] (RTS) [address] [place], [place] Accepts Medicaid  [facility]ellowship Tyhee [address]  [place] [phone] Substance Abuse/Addiction Treatment   [facility] Organization         Address  Phone  Notes  [facility]  [phone]   Domenic Schwab, PhD [address] Arlis Porta [place], [place]   [phone] or [phone]   [place] [address] [place], [place] [phone]   [address], [place], [place] [phone] Insurance/Medicaid/sponsorship through [facility] and Families [address], [address]                                    [place], [place] [phone] [place] [address] [place] [address], [place] [phone]    Dr. Adele Schilder  [phone]   [facility] Dept. 1) [address], [place] 2) [place] 3)  [place] [address], [place] [phone]  [phone]   [place] [phone] or [phone] (After Hours)

## 2017-09-01 ENCOUNTER — Emergency Department (HOSPITAL_COMMUNITY): Payer: Self-pay

## 2017-09-01 ENCOUNTER — Encounter (HOSPITAL_COMMUNITY): Payer: Self-pay | Admitting: *Deleted

## 2017-09-01 ENCOUNTER — Other Ambulatory Visit: Payer: Self-pay

## 2017-09-01 ENCOUNTER — Emergency Department (HOSPITAL_COMMUNITY)
Admission: EM | Admit: 2017-09-01 | Discharge: 2017-09-02 | Disposition: A | Discharge status: Other Acute Inpatient Hospital | Payer: Self-pay | Attending: Emergency Medicine | Admitting: Emergency Medicine

## 2017-09-01 DIAGNOSIS — F17200 Nicotine dependence, unspecified, uncomplicated: Secondary | ICD-10-CM | POA: Insufficient documentation

## 2017-09-01 DIAGNOSIS — H44001 Unspecified purulent endophthalmitis, right eye: Secondary | ICD-10-CM | POA: Insufficient documentation

## 2017-09-01 DIAGNOSIS — Z79899 Other long term (current) drug therapy: Secondary | ICD-10-CM | POA: Insufficient documentation

## 2017-09-01 HISTORY — DX: Personal history of (healed) traumatic fracture: Z87.81

## 2017-09-01 LAB — BASIC METABOLIC PANEL
Anion gap: 13 (ref 5–15)
BUN: 21 mg/dL — AB (ref 6–20)
CALCIUM: 8.8 mg/dL — AB (ref 8.9–10.3)
CO2: 20 mmol/L — ABNORMAL LOW (ref 22–32)
Chloride: 96 mmol/L — ABNORMAL LOW (ref 98–111)
Creatinine, Ser: 1.23 mg/dL (ref 0.61–1.24)
GFR calc Af Amer: >60 mL/min (ref >60)
GLUCOSE: 105 mg/dL — AB (ref 70–99)
Potassium: 4 mmol/L (ref 3.5–5.1)
Sodium: 129 mmol/L — ABNORMAL LOW (ref 135–145)

## 2017-09-01 LAB — CBC WITH DIFFERENTIAL/PLATELET
Abs Immature Granulocytes: 0.7 10*3/uL — ABNORMAL HIGH (ref 0.0–0.1)
Basophils Absolute: 0.1 10*3/uL (ref 0.0–0.1)
Basophils Relative: 0 %
EOS ABS: 0 10*3/uL (ref 0.0–0.7)
Eosinophils Relative: 0 %
HCT: 40.3 % (ref 39.0–52.0)
Hemoglobin: 13.6 g/dL (ref 13.0–17.0)
Immature Granulocytes: 3 %
Lymphocytes Relative: 8 %
Lymphs Abs: 1.8 10*3/uL (ref 0.7–4.0)
MCH: 29.6 pg (ref 26.0–34.0)
MCHC: 33.7 g/dL (ref 30.0–36.0)
MCV: 87.6 fL (ref 78.0–100.0)
MONO ABS: 1.6 10*3/uL — AB (ref 0.1–1.0)
MONOS PCT: 7 %
NEUTROS PCT: 82 %
Neutro Abs: 17.6 10*3/uL — ABNORMAL HIGH (ref 1.7–7.7)
Platelets: 385 10*3/uL (ref 150–400)
RBC: 4.6 MIL/uL (ref 4.22–5.81)
RDW: 13.5 % (ref 11.5–15.5)
WBC: 21.8 10*3/uL — ABNORMAL HIGH (ref 4.0–10.5)

## 2017-09-01 MED ORDER — PIPERACILLIN-TAZOBACTAM 3.375 G IVPB 30 MIN
3.3750 g | Freq: Once | INTRAVENOUS | Status: AC
  Administered 2017-09-02: 3.375 g via INTRAVENOUS
  Filled 2017-09-01: qty 50

## 2017-09-01 MED ORDER — TETRACAINE HCL 0.5 % OP SOLN
2.0000 [drp] | Freq: Once | OPHTHALMIC | Status: AC
  Administered 2017-09-01: 2 [drp] via OPHTHALMIC
  Filled 2017-09-01: qty 4

## 2017-09-01 MED ORDER — SODIUM CHLORIDE 0.9 % IV BOLUS
1000.0000 mL | Freq: Once | INTRAVENOUS | Status: AC
  Administered 2017-09-01: 1000 mL via INTRAVENOUS

## 2017-09-01 MED ORDER — IOHEXOL 300 MG/ML  SOLN
75.0000 mL | Freq: Once | INTRAMUSCULAR | Status: AC | PRN
  Administered 2017-09-01: 75 mL via INTRAVENOUS

## 2017-09-01 MED ORDER — SODIUM CHLORIDE 0.9 % IV BOLUS
1000.0000 mL | Freq: Once | INTRAVENOUS | Status: AC
  Administered 2017-09-02: 1000 mL via INTRAVENOUS

## 2017-09-01 MED ORDER — VANCOMYCIN HCL IN DEXTROSE 1-5 GM/200ML-% IV SOLN
1000.0000 mg | Freq: Once | INTRAVENOUS | Status: AC
  Administered 2017-09-02: 1000 mg via INTRAVENOUS
  Filled 2017-09-01: qty 200

## 2017-09-01 MED ORDER — KETOROLAC TROMETHAMINE 30 MG/ML IJ SOLN
30.0000 mg | Freq: Once | INTRAMUSCULAR | Status: AC
  Administered 2017-09-01: 30 mg via INTRAVENOUS
  Filled 2017-09-01: qty 1

## 2017-09-01 MED ORDER — MORPHINE SULFATE (PF) 4 MG/ML IV SOLN
4.0000 mg | Freq: Once | INTRAVENOUS | Status: AC
  Administered 2017-09-01: 4 mg via INTRAVENOUS
  Filled 2017-09-01: qty 1

## 2017-09-01 MED ORDER — FLUORESCEIN SODIUM 1 MG OP STRP
1.0000 | ORAL_STRIP | Freq: Once | OPHTHALMIC | Status: AC
  Administered 2017-09-01: 1 via OPHTHALMIC
  Filled 2017-09-01: qty 1

## 2017-09-01 MED ORDER — ONDANSETRON HCL 4 MG/2ML IJ SOLN
4.0000 mg | Freq: Once | INTRAMUSCULAR | Status: AC
  Administered 2017-09-01: 4 mg via INTRAVENOUS
  Filled 2017-09-01: qty 2

## 2017-09-01 NOTE — ED Notes (Signed)
Pt went to CT

## 2017-09-01 NOTE — ED Provider Notes (Signed)
[facility] EMERGENCY DEPARTMENT Provider Note   CSN: [id number] Arrival date & time: 09/01/17  8295     History   Chief Complaint Chief Complaint  Patient presents with  . Headache    HPI Brendan Sutton is a 32 y.o. male.  HPI Brendan Sutton is a 32 y.o. male presents to emergency department complaining of a headache and right eye pain.  Patient states he has had sinus infection for multiple weeks.  He states that that his sinus symptoms have started getting better, however he developed pain and redness to his right eye over the last 5 days.  He states his vision is blurred and he is unable to see any shadows, states he can see only light.  He states his eyes very painful.  He states there is pain with extraocular movements.  He denies any fever or chills.  He is tried NSAIDs and opiates that were given to him with no relief of his symptoms.  He continues to have a headache that he describes as pressure.  He states it is worse with coughing and sneezing.  The headache has been there for about 2 weeks.  No injuries to the IR the head.  No similar changes in the past.  Does not wear contacts or glasses.  Past Medical History:  Diagnosis Date  . H/O cervical fracture     There are no active problems to display for this patient.   History reviewed. No pertinent surgical history.      Home Medications    Prior to Admission medications   Medication Sig Start Date End Date Taking? Authorizing Provider  benzonatate (TESSALON) 100 MG capsule Take 1 capsule (100 mg total) by mouth every 8 (eight) hours. 11/27/13   Forcucci, Courtney, PA-C  Cetirizine HCl (ZYRTEC ALLERGY) 10 MG CAPS Take 1 capsule (10 mg total) by mouth at bedtime. 11/27/13   Forcucci, Courtney, PA-C  fluticasone (FLONASE) 50 MCG/ACT nasal spray Place 2 sprays into both nostrils daily. 11/27/13   Forcucci, Courtney, PA-C  GuaiFENesin (ROBITUSSIN CHEST CONGESTION PO) Take 10 mLs by mouth  daily as needed (for cough).    [provider]  HYDROcodone-acetaminophen (NORCO/VICODIN) 5-325 MG per tablet Take 1 tablet by mouth every 6 (six) hours as needed. 01/19/14   Sciacca, Marissa, PA-C  ibuprofen (ADVIL,MOTRIN) 400 MG tablet Take 1 tablet (400 mg total) by mouth every 8 (eight) hours as needed. 01/19/14   Sciacca, Marissa, PA-C  lidocaine (XYLOCAINE) 2 % solution Use as directed 20 mLs in the mouth or throat as needed for mouth pain. 11/27/13   Forcucci, Toni Amend, PA-C    Family History No family history on file.  Social History Social History   Tobacco Use  . Smoking status: Current Every Day Smoker    Packs/day: 0.50  . Smokeless tobacco: Never Used  Substance Use Topics  . Alcohol use: Yes  . Drug use: Yes    Types: Marijuana     Allergies   Patient has no known allergies.   Review of Systems Review of Systems  Constitutional: Negative for chills and fever.  HENT: Positive for congestion, rhinorrhea and sinus pain.   Eyes: Positive for photophobia, pain, discharge and redness.  Respiratory: Negative for cough, chest tightness and shortness of breath.   Cardiovascular: Negative for chest pain, palpitations and leg swelling.  Gastrointestinal: Negative for abdominal distention, abdominal pain, diarrhea, nausea and vomiting.  Genitourinary: Negative for dysuria, frequency, hematuria and urgency.  Musculoskeletal:  Negative for arthralgias, myalgias, neck pain and neck stiffness.  Skin: Negative for rash.  Allergic/Immunologic: Negative for immunocompromised state.  Neurological: Positive for headaches. Negative for dizziness, weakness, light-headedness and numbness.  All other systems reviewed and are negative.    Physical Exam Updated Vital Signs BP 100/72 (BP Location: Right Arm)   Pulse (!) 112   Temp 99.1 F (37.3 C) (Oral)   Resp 20   SpO2 99%   Physical Exam  Constitutional: He is oriented to person, place, and time. He appears  well-developed and well-nourished. No distress.  HENT:  Head: Normocephalic and atraumatic.  Eyes:  Right upper eyelid swelling.  Tenderness to palpation to the upper and lower eyelids.  Conjunctiva is injected.  Pupil is sluggish to react.  Pain with extraocular movements.  Normal extraocular movements.  No proptosis.  Neck: Neck supple.  Cardiovascular: Normal rate, regular rhythm and normal heart sounds.  Pulmonary/Chest: Effort normal. No respiratory distress. He has no wheezes. He has no rales.  Abdominal: Soft. Bowel sounds are normal. He exhibits no distension. There is no tenderness. There is no rebound.  Musculoskeletal: He exhibits no edema.  Neurological: He is alert and oriented to person, place, and time.  Skin: Skin is warm and dry.  Nursing note and vitals reviewed.    ED Treatments / Results  Labs (all labs ordered are listed, but only abnormal results are displayed)  EKG None  Radiology Ct Orbits W Contrast  Result Date: 09/01/2017 CLINICAL DATA:  32 y/o M; vision loss in the right eye for the past 5-6 days. Sinus infection for the past 5 months. EXAM: CT ORBITS WITH CONTRAST TECHNIQUE: Multidetector CT images was performed according to the standard protocol following intravenous contrast administration. CONTRAST:  75mL OMNIPAQUE IOHEXOL 300 MG/ML  SOLN COMPARISON:  06/29/2010 CT orbits. FINDINGS: Orbits: Diffuse irregular wall thickening of the right globe, mild surrounding inflammation within the right orbital compartment fat. Focal medial right globe retina or choroidal detachment (series 3, image 26) with subjacent low-attenuation effusion. No direct contiguous spread of sinus disease to the orbit is identified. Very small chronic appearing right lamina papyracea defect with herniation of extraconal fat (series 4, image 26). The left globe is unremarkable. Visualized sinuses: Extensive paranasal sinus disease in the frontal, ethmoid, and maxillary sinuses with polypoid  mucosal thickening and aerosolized secretions. Soft tissues: Negative. Limited intracranial: No significant or unexpected finding. IMPRESSION: 1. Diffuse irregular wall thickening of right globe with enhancement compatible with endophthalmitis or periscleritis. 2. 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 3. Mild surrounding right orbital compartment cellulitis. No proptosis. 4. Extensive paranasal sinus disease with aerosolized secretions compatible with acute sinusitis. No direct contiguous spread sinus disease to the orbit is identified. These results were called by telephone at the time of interpretation on 09/01/2017 at 11:47 pm to Dr. Jaynie Crumble , who verbally acknowledged these results. Electronically Signed   By: Mitzi Hansen M.D.   On: 09/01/2017 23:49    Procedures Procedures (including critical care time)  Medications Ordered in ED Medications  tetracaine (PONTOCAINE) 0.5 % ophthalmic solution 2 drop (has no administration in time range)  fluorescein ophthalmic strip 1 strip (has no administration in time range)  iohexol (OMNIPAQUE) 300 MG/ML solution 75 mL (has no administration in time range)     Initial Impression / Assessment and Plan / ED Course  I have reviewed the triage vital signs and the nursing notes.  Pertinent labs & imaging results  that were available during my care of the patient were reviewed by me and considered in my medical decision making (see chart for details).    She in the emergency department with right eye pain and headache.  On exam, conjunctiva is injected, pain with extraocular movements.  Will get labs, concerning for uveitis and possibly orbital cellulitis.  Will get CT orbits.   She has a white blood cell count is 21,000, he is however afebrile, nontoxic.  Heart rate initially elevated but came down with pain medications, currently in the 80s.  No evidence of sepsis or bacteremia.   He does not meet Sirs criteria.  CT scan pending.  Patient's visual acuity, he is unable to see anything but light in his right eye.  He is tonometer readings are 8 in the right eye.  No corneal abrasion on the floor seen stain.  No hypopyon or any other findings on further examination of the eye by me.  CT scan showing possible endophthalmitis, with possible retinal detachment.  I discussed this case with Dr. Ranae PilaLiles with ophthalmology who reviewed his scan and imaging that was obtained through [facility].  He recommended patient to be transferred to [facility] for further treatment.  He also advised to obtain blood cultures since 1 of the most common causes of this is IV drug use.  I did ask patient about IV drug use he earlier and asked him about it again he denies it to me.  His mother pulled me out to the hallway and stated that patient has history of the same and she would be surprised if he still using.  Again he is afebrile at this time, nontoxic, he is got normal vital signs.  I did treat him with vancomycin and Zosyn.  His blood cultures will be obtained.   Patient signed out at shift change pending consult and admission to [facility].  Vitals:   09/02/17 0047 09/02/17 0100 09/02/17 0130 09/02/17 0149  BP: 102/71  (!) 144/99 107/74  Pulse: 80 83  98  Resp: 16   16  Temp:    (!) 97 F (36.1 C)  TempSrc:    Oral  SpO2: 100% 100%  100%     Final Clinical Impressions(s) / ED Diagnoses   Final diagnoses:  Endophthalmia, right    ED Discharge Orders    None       Jaynie CrumbleKirichenko, Michaelene Dutan, PA-C 09/02/17 1601    Cathren LaineSteinl, Kevin, MD 09/02/17 1622

## 2017-09-01 NOTE — ED Triage Notes (Signed)
Pt presents with multiple complaints. C/o vision loss to r eye for the past 5-6 days, reports not being able to see anything from that eye. R eye noted to be very red. Has had a sinus infection for the past 5 months. C/o headache for the past 6 days as well, feels like "my head is going to explode." Has tried NSAIDs, opioids that aren't prescribed to him, and other "migraine medications" without relief

## 2017-09-02 LAB — BLOOD CULTURE ID PANEL (REFLEXED)
Acinetobacter baumannii: NOT DETECTED
CANDIDA GLABRATA: NOT DETECTED
CANDIDA KRUSEI: NOT DETECTED
CANDIDA PARAPSILOSIS: NOT DETECTED
CANDIDA TROPICALIS: NOT DETECTED
Candida albicans: NOT DETECTED
ENTEROBACTER CLOACAE COMPLEX: NOT DETECTED
ESCHERICHIA COLI: NOT DETECTED
Enterobacteriaceae species: NOT DETECTED
Enterococcus species: NOT DETECTED
Haemophilus influenzae: NOT DETECTED
KLEBSIELLA PNEUMONIAE: NOT DETECTED
Klebsiella oxytoca: NOT DETECTED
Listeria monocytogenes: NOT DETECTED
Methicillin resistance: DETECTED — AB
Neisseria meningitidis: NOT DETECTED
PROTEUS SPECIES: NOT DETECTED
Pseudomonas aeruginosa: NOT DETECTED
SERRATIA MARCESCENS: NOT DETECTED
Staphylococcus aureus (BCID): DETECTED — AB
Staphylococcus species: DETECTED — AB
Streptococcus agalactiae: NOT DETECTED
Streptococcus pneumoniae: NOT DETECTED
Streptococcus pyogenes: NOT DETECTED
Streptococcus species: NOT DETECTED

## 2017-09-02 MED ORDER — FENTANYL CITRATE (PF) 100 MCG/2ML IJ SOLN: 50.0000 ug | Freq: Once | INTRAMUSCULAR | Status: DC

## 2017-09-02 MED ORDER — HYDROMORPHONE HCL 1 MG/ML IJ SOLN
0.5000 mg | Freq: Once | INTRAMUSCULAR | Status: AC
  Administered 2017-09-02: 0.5 mg via INTRAVENOUS
  Filled 2017-09-02: qty 1

## 2017-09-02 NOTE — ED Provider Notes (Signed)
4:00 PM Reviewed pt's chart today. Pt's blood cultures grew Grap positive cocci, Staphylococcus aureus methicillin resistant found. I spoke with [facility] and related the results to the RN taking care of the patient. She is now aware and will contact provider.      Jaynie CrumbleKirichenko, Jerre Vandrunen, PA-C 09/02/17 1601    Cathren LaineSteinl, Kevin, MD 09/02/17 1622

## 2017-09-02 NOTE — ED Provider Notes (Signed)
Care assumed from previous provider PA Kirichenko. Please see their note for further details to include full history and physical. To summarize in short pt is a 32 year old male presents to the ED for evaluation of headache and right eye pain.  Patient also reports decreased vision in the right eye for the past 5 days. Case discussed, plan agreed upon.   Of note patient is a leukocytosis of 21,000.  He is afebrile.  No tachycardia or hypotension at this time.  Labs significant for hyponatremia 129 and bicarb was 20 otherwise labs reassuring.  CT scan was obtained that shows concern for endopthalmitis with possible retinal detachment.  Prior provider spoke with our ophthalmologist on call Dr. Randon GoldsmithLyles.  He has recommended that patient be transferred to [facility] for further ophthalmology work-up.  He is also concerned that this diagnosis is likely related to possible endocarditis or IV drug use.  Patient denies drug use however prior provider spoke with the mother in the hallway who was concerned that patient may be using drugs.  I spoke with Dr. Earl ManyBarnwell with ophthalmology at [facility].  She has accepted patient in transfer.  Patient is hemodynamic stable this time.  He is currently receiving vancomycin and Zosyn.  Will give additional pain medication.  Patient is comfortable plan of transfer.       Rise MuLeaphart, Kenneth T, PA-C 09/02/17 0132    Derwood KaplanNanavati, Ankit, MD 09/02/17 [phone]

## 2017-09-04 LAB — CULTURE, BLOOD (ROUTINE X 2)
SPECIAL REQUESTS: ADEQUATE
Special Requests: ADEQUATE

## 2018-06-11 ENCOUNTER — Other Ambulatory Visit: Payer: Self-pay

## 2018-06-11 ENCOUNTER — Encounter (HOSPITAL_COMMUNITY): Payer: Self-pay | Admitting: Emergency Medicine

## 2018-06-11 ENCOUNTER — Emergency Department (HOSPITAL_COMMUNITY)
Admission: EM | Admit: 2018-06-11 | Discharge: 2018-06-12 | Disposition: A | Discharge status: Home / Self Care | Payer: Self-pay | Attending: Emergency Medicine | Admitting: Emergency Medicine

## 2018-06-11 DIAGNOSIS — R509 Fever, unspecified: Secondary | ICD-10-CM | POA: Insufficient documentation

## 2018-06-11 DIAGNOSIS — Z20828 Contact with and (suspected) exposure to other viral communicable diseases: Secondary | ICD-10-CM | POA: Insufficient documentation

## 2018-06-11 DIAGNOSIS — M791 Myalgia, unspecified site: Secondary | ICD-10-CM | POA: Insufficient documentation

## 2018-06-11 DIAGNOSIS — F17200 Nicotine dependence, unspecified, uncomplicated: Secondary | ICD-10-CM | POA: Insufficient documentation

## 2018-06-11 DIAGNOSIS — H5711 Ocular pain, right eye: Secondary | ICD-10-CM | POA: Insufficient documentation

## 2018-06-11 HISTORY — DX: Unspecified visual loss: H54.7

## 2018-06-11 LAB — CBC WITH DIFFERENTIAL/PLATELET
Abs Immature Granulocytes: 0.03 10*3/uL (ref 0.00–0.07)
Basophils Absolute: 0.1 10*3/uL (ref 0.0–0.1)
Basophils Relative: 1 %
Eosinophils Absolute: 0.1 10*3/uL (ref 0.0–0.5)
Eosinophils Relative: 1 %
HCT: 38.9 % — ABNORMAL LOW (ref 39.0–52.0)
Hemoglobin: 13 g/dL (ref 13.0–17.0)
Immature Granulocytes: 0 %
Lymphocytes Relative: 23 %
Lymphs Abs: 1.6 10*3/uL (ref 0.7–4.0)
MCH: 29 pg (ref 26.0–34.0)
MCHC: 33.4 g/dL (ref 30.0–36.0)
MCV: 86.6 fL (ref 80.0–100.0)
Monocytes Absolute: 0.9 10*3/uL (ref 0.1–1.0)
Monocytes Relative: 12 %
Neutro Abs: 4.4 10*3/uL (ref 1.7–7.7)
Neutrophils Relative %: 63 %
Platelets: 366 10*3/uL (ref 150–400)
RBC: 4.49 MIL/uL (ref 4.22–5.81)
RDW: 13.3 % (ref 11.5–15.5)
WBC: 7 10*3/uL (ref 4.0–10.5)
nRBC: 0 % (ref 0.0–0.2)

## 2018-06-11 LAB — COMPREHENSIVE METABOLIC PANEL
ALT: 54 U/L — ABNORMAL HIGH (ref 0–44)
AST: 35 U/L (ref 15–41)
Albumin: 4 g/dL (ref 3.5–5.0)
Alkaline Phosphatase: 62 U/L (ref 38–126)
Anion gap: 9 (ref 5–15)
BUN: 12 mg/dL (ref 6–20)
CO2: 24 mmol/L (ref 22–32)
Calcium: 9 mg/dL (ref 8.9–10.3)
Chloride: 103 mmol/L (ref 98–111)
Creatinine, Ser: 1.12 mg/dL (ref 0.61–1.24)
GFR calc Af Amer: >60 mL/min (ref >60)
GFR calc non Af Amer: >60 mL/min (ref >60)
Glucose, Bld: 79 mg/dL (ref 70–99)
Potassium: 4.1 mmol/L (ref 3.5–5.1)
Sodium: 136 mmol/L (ref 135–145)
Total Bilirubin: 0.5 mg/dL (ref 0.3–1.2)
Total Protein: 7 g/dL (ref 6.5–8.1)

## 2018-06-11 MED ORDER — SODIUM CHLORIDE 0.9% FLUSH: 3.0000 mL | Freq: Once | INTRAVENOUS | Status: DC

## 2018-06-11 NOTE — ED Triage Notes (Signed)
Pt c/o fever x 3 days and right eye pain. Hx blindness in right eye. Denies cough/fever/chest pain/shortness of breath. Hx endocarditis

## 2018-06-12 ENCOUNTER — Emergency Department (HOSPITAL_COMMUNITY): Payer: Self-pay

## 2018-06-12 LAB — SARS CORONAVIRUS 2 BY RT PCR (HOSPITAL ORDER, PERFORMED IN ~~LOC~~ HOSPITAL LAB): SARS Coronavirus 2: NEGATIVE

## 2018-06-12 MED ORDER — IBUPROFEN 400 MG PO TABS
600.0000 mg | ORAL_TABLET | Freq: Once | ORAL | Status: AC
  Administered 2018-06-12: 600 mg via ORAL
  Filled 2018-06-12: qty 1

## 2018-06-12 NOTE — Discharge Instructions (Signed)
Continue Tylenol and/or ibuprofen for fever and aches. Drink lots of fluids and rest.   If you develop worsening symptoms or new concerns, return to the emergency department for further evaluation.   Please use the provided resource list to find a primary care provider for routine care.

## 2018-06-12 NOTE — ED Provider Notes (Signed)
[facility] EMERGENCY DEPARTMENT Provider Note   CSN: [id number] Arrival date & time: 06/11/18  2007    History   Chief Complaint Chief Complaint  Patient presents with  . Fever  . Eye Pain    HPI Brendan Sutton is a 33 y.o. male.     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 He complains also of right eye pain similar to chronic pain since previous infection but that is worse. No congestion, sore throat, cough, SOB, chest pain, nausea, vomiting or diarrhea. He is eating and drinking without difficulty. No urinary symptoms. He denies being around anyone who has been ill.   The history is provided by the patient. No language interpreter was used.  Fever  Associated symptoms: myalgias   Associated symptoms: no chest pain, no congestion, no cough, no diarrhea, no dysuria, no nausea, no rash, no sore throat and no vomiting   Eye Pain  Pertinent negatives include no chest pain, no abdominal pain and no shortness of breath.    Past Medical History:  Diagnosis Date  . Blindness    right eye  . H/O cervical fracture     There are no active problems to display for this patient.   History reviewed. No pertinent surgical history.      Home Medications    Prior to Admission medications   Medication Sig Start Date End Date Taking? Authorizing Provider  benzonatate (TESSALON) 100 MG capsule Take 1 capsule (100 mg total) by mouth every 8 (eight) hours. Patient not taking: Reported on 09/01/2017 11/27/13   Shirleen Schirmer, PA-C  Cetirizine HCl (ZYRTEC ALLERGY) 10 MG CAPS Take 1 capsule (10 mg total) by mouth at bedtime. Patient not taking: Reported on 09/01/2017 11/27/13   Shirleen Schirmer, PA-C  fluticasone [facility]) 50 MCG/ACT nasal spray Place 2 sprays into both nostrils daily. Patient not taking: Reported on 09/01/2017 11/27/13    Shirleen Schirmer, PA-C  HYDROcodone-acetaminophen (NORCO/VICODIN) 5-325 MG per tablet Take 1 tablet by mouth every 6 (six) hours as needed. Patient not taking: Reported on 09/01/2017 01/19/14   Sciacca, Ashok Cordia, PA-C  ibuprofen (ADVIL,MOTRIN) 400 MG tablet Take 1 tablet (400 mg total) by mouth every 8 (eight) hours as needed. Patient not taking: Reported on 09/01/2017 01/19/14   Sciacca, Marissa, PA-C  lidocaine (XYLOCAINE) 2 % solution Use as directed 20 mLs in the mouth or throat as needed for mouth pain. Patient not taking: Reported on 09/01/2017 11/27/13   Shirleen Schirmer, PA-C    Family History No family history on file.  Social History Social History   Tobacco Use  . Smoking status: Current Every Day Smoker    Packs/day: 0.50  . Smokeless tobacco: Never Used  Substance Use Topics  . Alcohol use: Yes  . Drug use: Yes    Types: Marijuana     Allergies   Patient has no known allergies.   Review of Systems Review of Systems  Constitutional: Positive for fever.  HENT: Negative for congestion and sore throat.   Eyes: Positive for pain.  Respiratory: Negative for cough and shortness of breath.   Cardiovascular: Negative for chest pain.  Gastrointestinal: Negative for abdominal pain, diarrhea, nausea and vomiting.  Genitourinary: Negative for dysuria.  Musculoskeletal: Positive for myalgias.  Skin: Negative for color change and rash.  Neurological: Negative for weakness.     Physical Exam Updated Vital Signs  BP (!) 157/124   Pulse (!) 57   Temp 98.5 F (36.9 C) (Oral)   Resp 14   Ht 5\' 9"  (1.753 m)   Wt 67.6 kg   SpO2 98%   BMI 22.00 kg/m   Physical Exam Vitals signs and nursing note reviewed.  HENT:     Head: Normocephalic.     Mouth/Throat:     Mouth: Mucous membranes are moist.     Pharynx: Oropharynx is clear.  Eyes:     Extraocular Movements: Extraocular movements intact.     Comments: Right cornea opaque.  Neck:     Musculoskeletal: Normal range of  motion and neck supple.  Cardiovascular:     Rate and Rhythm: Normal rate.     Heart sounds: No murmur.  Pulmonary:     Effort: Pulmonary effort is normal.     Breath sounds: No wheezing, rhonchi or rales.  Abdominal:     General: There is no distension.     Palpations: Abdomen is soft.     Tenderness: There is no abdominal tenderness.  Musculoskeletal: Normal range of motion.  Skin:    General: Skin is warm and dry.  Neurological:     General: No focal deficit present.     Mental Status: He is oriented to person, place, and time.      ED Treatments / Results  Labs (all labs ordered are listed, but only abnormal results are displayed) Labs Reviewed  COMPREHENSIVE METABOLIC PANEL - Abnormal; Notable for the following components:      Result Value   ALT 54 (*)    All other components within normal limits  CBC WITH DIFFERENTIAL/PLATELET - Abnormal; Notable for the following components:   HCT 38.9 (*)    All other components within normal limits   Results for orders placed or performed during the hospital encounter of 06/11/18  SARS Coronavirus 2 (CEPHEID- Performed in [facility] hospital lab), [facility] Order  Result Value Ref Range   SARS Coronavirus 2 NEGATIVE NEGATIVE  Comprehensive metabolic panel  Result Value Ref Range   Sodium 136 135 - 145 mmol/L   Potassium 4.1 3.5 - 5.1 mmol/L   Chloride 103 98 - 111 mmol/L   CO2 24 22 - 32 mmol/L   Glucose, Bld 79 70 - 99 mg/dL   BUN 12 6 - 20 mg/dL   Creatinine, Ser 0.981.12 0.61 - 1.24 mg/dL   Calcium 9.0 8.9 - 11.910.3 mg/dL   Total Protein 7.0 6.5 - 8.1 g/dL   Albumin 4.0 3.5 - 5.0 g/dL   AST 35 15 - 41 U/L   ALT 54 (H) 0 - 44 U/L   Alkaline Phosphatase 62 38 - 126 U/L   Total Bilirubin 0.5 0.3 - 1.2 mg/dL   GFR calc non Af Amer >60 >60 mL/min   GFR calc Af Amer >60 >60 mL/min   Anion gap 9 5 - 15  CBC with Differential  Result Value Ref Range   WBC 7.0 4.0 - 10.5 K/uL   RBC 4.49 4.22 - 5.81 MIL/uL   Hemoglobin 13.0 13.0 -  17.0 g/dL   HCT 14.738.9 (L) 82.939.0 - 56.252.0 %   MCV 86.6 80.0 - 100.0 fL   MCH 29.0 26.0 - 34.0 pg   MCHC 33.4 30.0 - 36.0 g/dL   RDW 13.013.3 86.511.5 - 78.415.5 %   Platelets 366 150 - 400 K/uL   nRBC 0.0 0.0 - 0.2 %   Neutrophils Relative % 63 %  Neutro Abs 4.4 1.7 - 7.7 K/uL   Lymphocytes Relative 23 %   Lymphs Abs 1.6 0.7 - 4.0 K/uL   Monocytes Relative 12 %   Monocytes Absolute 0.9 0.1 - 1.0 K/uL   Eosinophils Relative 1 %   Eosinophils Absolute 0.1 0.0 - 0.5 K/uL   Basophils Relative 1 %   Basophils Absolute 0.1 0.0 - 0.1 K/uL   Immature Granulocytes 0 %   Abs Immature Granulocytes 0.03 0.00 - 0.07 K/uL    EKG None  Radiology No results found. Dg Chest Portable 1 View  Result Date: 06/12/2018 CLINICAL DATA:  Fever. EXAM: PORTABLE CHEST 1 VIEW COMPARISON:  10/24/2005 FINDINGS: The heart size is stable. There is no pneumothorax. No acute osseous abnormality. There is a density at the medial right lung apex. No significant pleural effusion. IMPRESSION: 1. No acute cardiopulmonary process. 2. Right apical density of unknown clinical significance. This may be secondary to the patient's positioning. Follow-up two-view chest x-ray is recommended in 4-6 weeks to confirm resolution of this finding. Electronically Signed   By: Katherine Mantle M.D.   On: 06/12/2018 03:25    Procedures Procedures (including critical care time)  Medications Ordered in ED Medications  sodium chloride flush (NS) 0.9 % injection 3 mL (has no administration in time range)     Initial Impression / Assessment and Plan / ED Course  I have reviewed the triage vital signs and the nursing notes.  Pertinent labs & imaging results that were available during my care of the patient were reviewed by me and considered in my medical decision making (see chart for details).        Patient to ED for evaluation of fever with reported Tmax 102, and generalized body aches for the past 3 days.   The patient is well appearing,  nontoxic. VSS, afebrile for entire encounter in the ED. He is eating and drinking in the room. 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 No symptoms to suggest recurrent endocarditis. No objective findings concerning for orbital/periorbital infection. No leukocytosis. COVID negative.   He is felt appropriate for discharge home. Discussed return precautions. Will provide resources to get established with primary care.   Final Clinical Impressions(s) / ED Diagnoses   Final diagnoses:  None   1. Febrile illness 2. Myalgias  ED Discharge Orders    None       Elpidio Anis, Cordelia Poche 06/12/18 [id number]    Mesner, Barbara Cower, MD 06/12/18 [id number]

## 2019-12-10 IMAGING — DX PORTABLE CHEST - 1 VIEW
1 series · 1 of 1 positions shown · non-contrast
Comparison: 10/24/2005

CLINICAL DATA: Fever.

EXAM:
PORTABLE CHEST 1 VIEW

[chest]
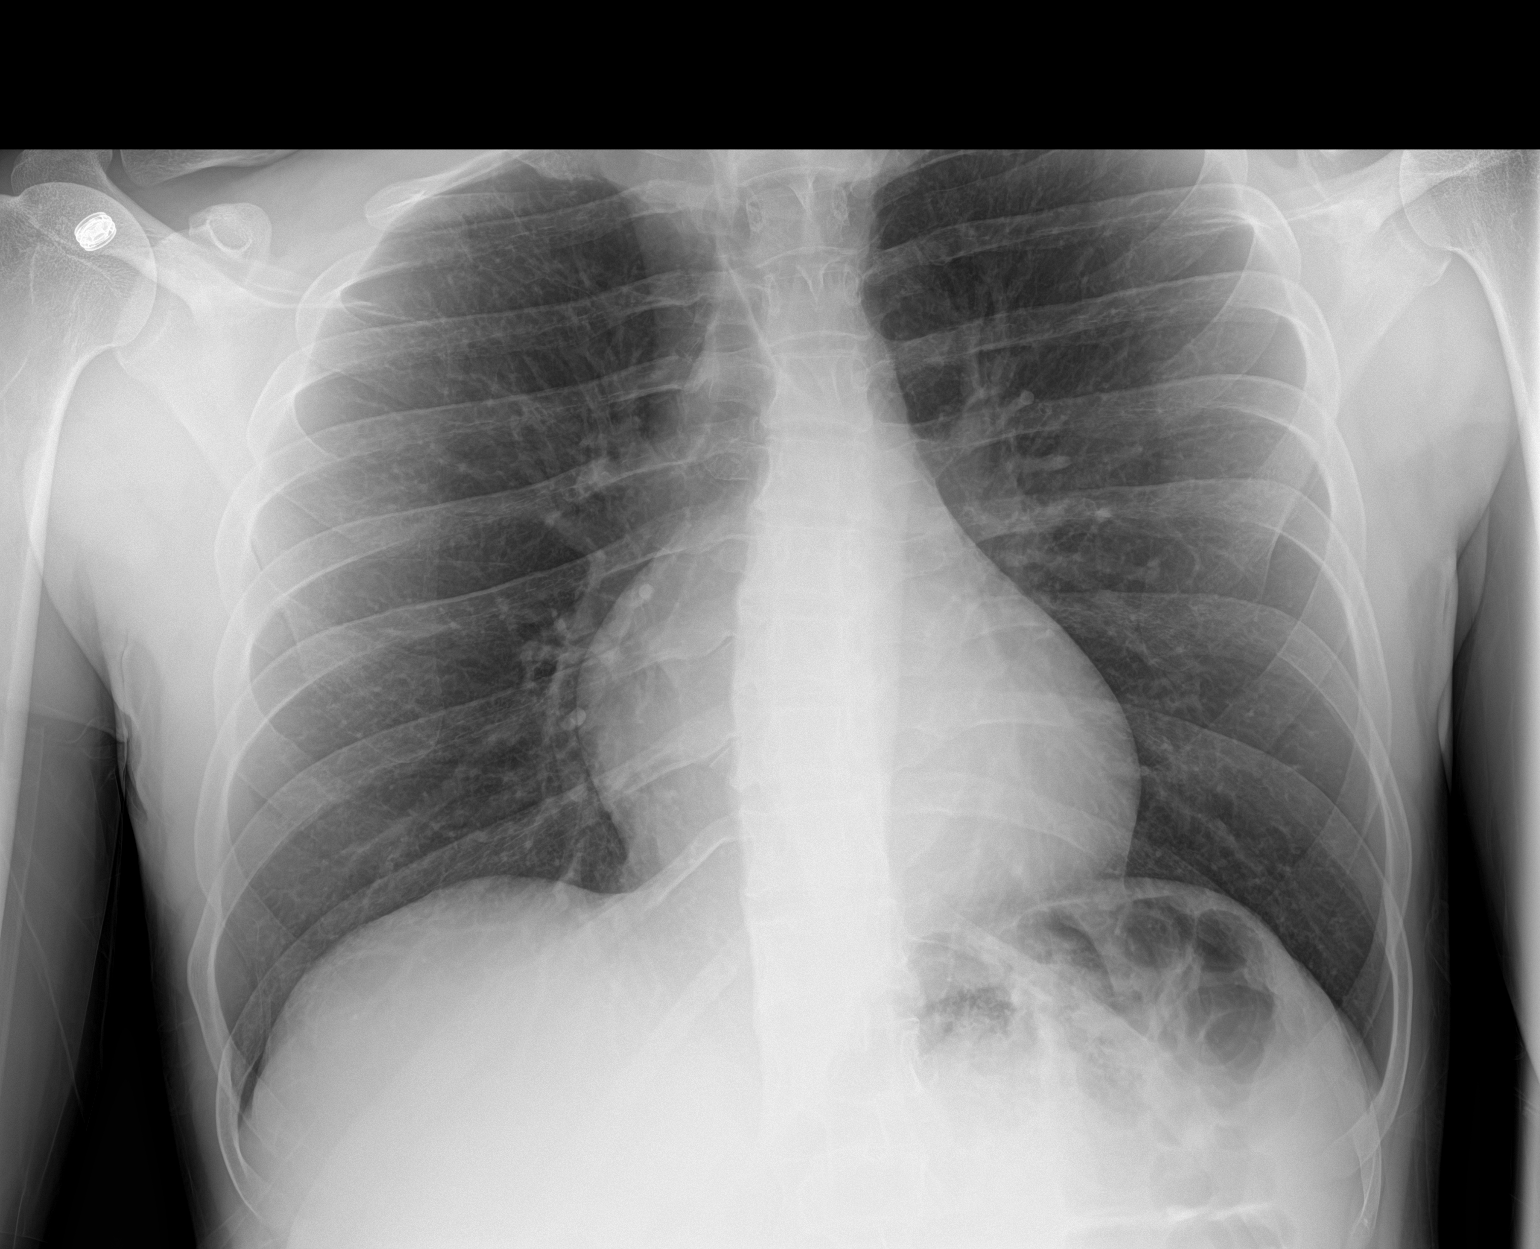

[1 of 1 positions shown; findings below may reference images not displayed]

FINDINGS: The heart size is stable. There is no pneumothorax. No acute osseous
abnormality. There is a density at the medial right lung apex. No
significant pleural effusion.
IMPRESSION: 1. No acute cardiopulmonary process.
2. Right apical density of unknown clinical significance. This may
be secondary to the patient's positioning. Follow-up two-view chest
x-ray is recommended in 4-6 weeks to confirm resolution of this
finding.

## 2022-06-14 ENCOUNTER — Other Ambulatory Visit: Payer: Self-pay

## 2022-06-14 ENCOUNTER — Emergency Department (HOSPITAL_BASED_OUTPATIENT_CLINIC_OR_DEPARTMENT_OTHER)
Admission: EM | Admit: 2022-06-14 | Discharge: 2022-06-15 | Disposition: A | Discharge status: Home / Self Care | Payer: Self-pay | Attending: Emergency Medicine | Admitting: Emergency Medicine

## 2022-06-14 ENCOUNTER — Encounter (HOSPITAL_BASED_OUTPATIENT_CLINIC_OR_DEPARTMENT_OTHER): Payer: Self-pay

## 2022-06-14 DIAGNOSIS — K029 Dental caries, unspecified: Secondary | ICD-10-CM | POA: Diagnosis not present

## 2022-06-14 DIAGNOSIS — K0889 Other specified disorders of teeth and supporting structures: Secondary | ICD-10-CM | POA: Diagnosis not present

## 2022-06-14 MED ORDER — ACETAMINOPHEN 500 MG PO TABS
1000.0000 mg | ORAL_TABLET | Freq: Once | ORAL | Status: AC
  Administered 2022-06-15: 1000 mg via ORAL
  Filled 2022-06-14: qty 2

## 2022-06-14 NOTE — ED Triage Notes (Signed)
Patient here POV from Home.  Endorses Dental Infections and Cavities for Years. Seeks Evaluation for Same today. Some nausea. No Fevers.   NAD Noted during Triage. A&Ox4. GCS 15. Ambulatory.

## 2022-06-15 MED ORDER — HYDROCODONE-ACETAMINOPHEN 5-325 MG PO TABS: 1.0000 | ORAL_TABLET | Freq: Four times a day (QID) | ORAL | 0 refills | Status: DC | PRN

## 2022-06-15 MED ORDER — CLINDAMYCIN HCL 300 MG PO CAPS: 300.0000 mg | ORAL_CAPSULE | Freq: Four times a day (QID) | ORAL | 0 refills | Status: DC

## 2022-06-15 MED ORDER — OXYCODONE-ACETAMINOPHEN 5-325 MG PO TABS
2.0000 | ORAL_TABLET | Freq: Once | ORAL | Status: AC
  Administered 2022-06-15: 2 via ORAL
  Filled 2022-06-15: qty 2

## 2022-06-15 MED ORDER — CLINDAMYCIN HCL 150 MG PO CAPS
300.0000 mg | ORAL_CAPSULE | Freq: Once | ORAL | Status: AC
  Administered 2022-06-15: 300 mg via ORAL
  Filled 2022-06-15: qty 2

## 2022-06-15 NOTE — ED Notes (Signed)
RN reviewed discharge instructions with pt. Pt verbalized understanding and had no further questions 

## 2022-06-15 NOTE — ED Provider Notes (Signed)
[place] EMERGENCY DEPARTMENT AT [facility] Provider Note   CSN: [id number] Arrival date & time: 06/14/22  2348     History  Chief Complaint  Patient presents with   Dental Pain    Brendan Sutton is a 37 y.o. male.  Patient is a 37 year old male with no significant past medical history.  Patient presenting today with complaints of dental pain.  This has been worsening over the past several days.  He describes pain to the upper molars on both sides.  No fevers or chills.  No difficulty breathing or swallowing.  He has been taking Tylenol with minimal relief.  The history is provided by the patient.       Home Medications Prior to Admission medications   Medication Sig Start Date End Date Taking? Authorizing Provider  benzonatate (TESSALON) 100 MG capsule Take 1 capsule (100 mg total) by mouth every 8 (eight) hours. Patient not taking: Reported on 09/01/2017 11/27/13   Shirleen Schirmer, PA-C  Cetirizine HCl (ZYRTEC ALLERGY) 10 MG CAPS Take 1 capsule (10 mg total) by mouth at bedtime. Patient not taking: Reported on 09/01/2017 11/27/13   Shirleen Schirmer, PA-C  fluticasone [facility]) 50 MCG/ACT nasal spray Place 2 sprays into both nostrils daily. Patient not taking: Reported on 09/01/2017 11/27/13   Shirleen Schirmer, PA-C  HYDROcodone-acetaminophen (NORCO/VICODIN) 5-325 MG per tablet Take 1 tablet by mouth every 6 (six) hours as needed. Patient not taking: Reported on 09/01/2017 01/19/14   Sciacca, Ashok Cordia, PA-C  ibuprofen (ADVIL,MOTRIN) 400 MG tablet Take 1 tablet (400 mg total) by mouth every 8 (eight) hours as needed. Patient not taking: Reported on 09/01/2017 01/19/14   Sciacca, Marissa, PA-C  lidocaine (XYLOCAINE) 2 % solution Use as directed 20 mLs in the mouth or throat as needed for mouth pain. Patient not taking: Reported on 09/01/2017 11/27/13   Shirleen Schirmer, PA-C      Allergies    Patient has no known allergies.    Review of Systems   Review of Systems   All other systems reviewed and are negative.   Physical Exam Updated Vital Signs BP 114/74 (BP Location: Right Arm)   Pulse (!) 104   Temp 98.4 F (36.9 C) (Oral)   Resp 18   Ht 5\' 9"  (1.753 m)   Wt 67.6 kg   SpO2 97%   BMI 22.01 kg/m  Physical Exam Vitals and nursing note reviewed.  Constitutional:      Appearance: Normal appearance.  HENT:     Head: Normocephalic and atraumatic.     Mouth/Throat:     Comments: Patient has poor dentition with multiple, heavily decayed teeth.  No obvious abscess, but there is gingival inflammation throughout. Pulmonary:     Effort: Pulmonary effort is normal.  Skin:    General: Skin is warm and dry.  Neurological:     Mental Status: He is alert and oriented to person, place, and time.     ED Results / Procedures / Treatments   Labs (all labs ordered are listed, but only abnormal results are displayed) Labs Reviewed - No data to display  EKG None  Radiology No results found.  Procedures Procedures    Medications Ordered in ED Medications  oxyCODONE-acetaminophen (PERCOCET/ROXICET) 5-325 MG per tablet 2 tablet (has no administration in time range)  clindamycin (CLEOCIN) capsule 300 mg (has no administration in time range)  acetaminophen (TYLENOL) tablet 1,000 mg (1,000 mg Oral Given 06/15/22 0000)    ED Course/ Medical Decision Making/ A&P  Patient presenting with dental pain.  He will be treated with clindamycin, given a small quantity of pain medication, and is to follow-up with dentistry if not improving.  Final Clinical Impression(s) / ED Diagnoses Final diagnoses:  None    Rx / DC Orders ED Discharge Orders     None         Geoffery Lyons, MD 06/15/22 [phone]

## 2022-06-15 NOTE — Discharge Instructions (Signed)
Begin taking clindamycin as prescribed.  Take ibuprofen 600 mg every 6 hours as needed for pain.  Begin taking hydrocodone as prescribed as needed for pain not relieved with ibuprofen.  Follow-up with dentistry. 

## 2022-06-18 ENCOUNTER — Emergency Department (HOSPITAL_COMMUNITY)
Admission: EM | Admit: 2022-06-18 | Discharge: 2022-06-18 | Disposition: A | Discharge status: Home / Self Care | Payer: 59 | Attending: Emergency Medicine | Admitting: Emergency Medicine

## 2022-06-18 ENCOUNTER — Other Ambulatory Visit: Payer: Self-pay

## 2022-06-18 ENCOUNTER — Encounter (HOSPITAL_COMMUNITY): Payer: Self-pay

## 2022-06-18 DIAGNOSIS — T402X1A Poisoning by other opioids, accidental (unintentional), initial encounter: Secondary | ICD-10-CM | POA: Diagnosis not present

## 2022-06-18 DIAGNOSIS — X58XXXA Exposure to other specified factors, initial encounter: Secondary | ICD-10-CM | POA: Insufficient documentation

## 2022-06-18 DIAGNOSIS — D72829 Elevated white blood cell count, unspecified: Secondary | ICD-10-CM | POA: Insufficient documentation

## 2022-06-18 DIAGNOSIS — R9431 Abnormal electrocardiogram [ECG] [EKG]: Secondary | ICD-10-CM | POA: Diagnosis not present

## 2022-06-18 DIAGNOSIS — R0689 Other abnormalities of breathing: Secondary | ICD-10-CM | POA: Diagnosis not present

## 2022-06-18 DIAGNOSIS — R0902 Hypoxemia: Secondary | ICD-10-CM | POA: Diagnosis not present

## 2022-06-18 DIAGNOSIS — T50901A Poisoning by unspecified drugs, medicaments and biological substances, accidental (unintentional), initial encounter: Secondary | ICD-10-CM | POA: Diagnosis not present

## 2022-06-18 DIAGNOSIS — T50904A Poisoning by unspecified drugs, medicaments and biological substances, undetermined, initial encounter: Secondary | ICD-10-CM

## 2022-06-18 DIAGNOSIS — T6594XA Toxic effect of unspecified substance, undetermined, initial encounter: Secondary | ICD-10-CM | POA: Diagnosis not present

## 2022-06-18 DIAGNOSIS — R6889 Other general symptoms and signs: Secondary | ICD-10-CM | POA: Diagnosis not present

## 2022-06-18 DIAGNOSIS — R61 Generalized hyperhidrosis: Secondary | ICD-10-CM | POA: Diagnosis not present

## 2022-06-18 LAB — CBC WITH DIFFERENTIAL/PLATELET
Abs Immature Granulocytes: 0.02 10*3/uL (ref 0.00–0.07)
Basophils Absolute: 0.1 10*3/uL (ref 0.0–0.1)
Basophils Relative: 1 %
Eosinophils Absolute: 0.6 10*3/uL — ABNORMAL HIGH (ref 0.0–0.5)
Eosinophils Relative: 5 %
HCT: 36.1 % — ABNORMAL LOW (ref 39.0–52.0)
Hemoglobin: 12.2 g/dL — ABNORMAL LOW (ref 13.0–17.0)
Immature Granulocytes: 0 %
Lymphocytes Relative: 31 %
Lymphs Abs: 3.3 10*3/uL (ref 0.7–4.0)
MCH: 30.5 pg (ref 26.0–34.0)
MCHC: 33.8 g/dL (ref 30.0–36.0)
MCV: 90.3 fL (ref 80.0–100.0)
Monocytes Absolute: 0.6 10*3/uL (ref 0.1–1.0)
Monocytes Relative: 5 %
Neutro Abs: 6.2 10*3/uL (ref 1.7–7.7)
Neutrophils Relative %: 58 %
Platelets: 413 10*3/uL — ABNORMAL HIGH (ref 150–400)
RBC: 4 MIL/uL — ABNORMAL LOW (ref 4.22–5.81)
RDW: 13.9 % (ref 11.5–15.5)
WBC: 10.7 10*3/uL — ABNORMAL HIGH (ref 4.0–10.5)
nRBC: 0 % (ref 0.0–0.2)

## 2022-06-18 LAB — COMPREHENSIVE METABOLIC PANEL
ALT: 29 U/L (ref 0–44)
AST: 15 U/L (ref 15–41)
Albumin: 3.8 g/dL (ref 3.5–5.0)
Alkaline Phosphatase: 38 U/L (ref 38–126)
Anion gap: 6 (ref 5–15)
BUN: 13 mg/dL (ref 6–20)
CO2: 25 mmol/L (ref 22–32)
Calcium: 8.3 mg/dL — ABNORMAL LOW (ref 8.9–10.3)
Chloride: 111 mmol/L (ref 98–111)
Creatinine, Ser: 0.89 mg/dL (ref 0.61–1.24)
GFR, Estimated: >60 mL/min (ref >60)
Glucose, Bld: 112 mg/dL — ABNORMAL HIGH (ref 70–99)
Potassium: 3.9 mmol/L (ref 3.5–5.1)
Sodium: 142 mmol/L (ref 135–145)
Total Bilirubin: 0.6 mg/dL (ref 0.3–1.2)
Total Protein: 6.5 g/dL (ref 6.5–8.1)

## 2022-06-18 LAB — ACETAMINOPHEN LEVEL
Acetaminophen (Tylenol), Serum: <10 ug/mL — ABNORMAL LOW (ref 10–30)
Acetaminophen (Tylenol), Serum: <10 ug/mL — ABNORMAL LOW (ref 10–30)

## 2022-06-18 LAB — SALICYLATE LEVEL: Salicylate Lvl: <7 mg/dL — ABNORMAL LOW (ref 7.0–30.0)

## 2022-06-18 MED ORDER — NALOXONE HCL 0.4 MG/ML IJ SOLN
0.4000 mg | Freq: Once | INTRAMUSCULAR | Status: AC
  Administered 2022-06-18: 0.4 mg via INTRAVENOUS
  Filled 2022-06-18: qty 1

## 2022-06-18 NOTE — Discharge Instructions (Signed)
Please avoid using Vicodin if not used for prescription purposes.  If symptoms change or worsen please return to ER.

## 2022-06-18 NOTE — ED Notes (Signed)
Pt remains asleep in room with police outside.

## 2022-06-18 NOTE — ED Provider Notes (Signed)
[place] EMERGENCY DEPARTMENT AT [facility] Provider Note   CSN: [id number] Arrival date & time: 06/18/22  0049     History Chief Complaint  Patient presents with   Drug Overdose    Brendan Sutton is a 37 y.o. 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 He was brought in by EMS. Story obtained by [employer] and the patient. The patient reports that he took 2-3 vicodin at an unknown time last night. [employer] reports that they were on the scene at 0000 today and administered Narcan. The patient's wife was there and was unresponsive as well with multiple narcotics in the vehicle. The patient was found leaning on his driver's side door outside. The patient reports that he feels fine.  Denies any chest pain, shortness of breath, belly pain, nausea, vomiting.   Drug Overdose Pertinent negatives include no chest pain, no abdominal pain and no shortness of breath.       Home Medications Prior to Admission medications   Medication Sig Start Date End Date Taking? Authorizing Provider  clindamycin (CLEOCIN) 300 MG capsule Take 1 capsule (300 mg total) by mouth 4 (four) times daily. X 7 days 06/15/22  Yes Delo, Cortez Flippen Lam, MD  HYDROcodone-acetaminophen (NORCO) 5-325 MG tablet Take 1-2 tablets by mouth every 6 (six) hours as needed. Patient taking differently: Take 1-2 tablets by mouth every 6 (six) hours as needed for moderate pain or severe pain. 06/15/22  Yes Delo, Meilyn Heindl Lam, MD  benzonatate (TESSALON) 100 MG capsule Take 1 capsule (100 mg total) by mouth every 8 (eight) hours. Patient not taking: Reported on 09/01/2017 11/27/13   Shirleen Schirmer, PA-C  Cetirizine HCl (ZYRTEC ALLERGY) 10 MG CAPS Take 1 capsule (10 mg total) by mouth at bedtime. Patient not taking: Reported on 09/01/2017 11/27/13   Shirleen Schirmer, PA-C  fluticasone [facility]) 50 MCG/ACT nasal spray Place 2 sprays into both nostrils daily. Patient not taking: Reported  on 09/01/2017 11/27/13   Shirleen Schirmer, PA-C  ibuprofen (ADVIL,MOTRIN) 400 MG tablet Take 1 tablet (400 mg total) by mouth every 8 (eight) hours as needed. Patient not taking: Reported on 09/01/2017 01/19/14   Sciacca, Marissa, PA-C  lidocaine (XYLOCAINE) 2 % solution Use as directed 20 mLs in the mouth or throat as needed for mouth pain. Patient not taking: Reported on 09/01/2017 11/27/13   Shirleen Schirmer, PA-C      Allergies    Patient has no known allergies.    Review of Systems   Review of Systems  Constitutional:  Negative for chills and fever.  Respiratory:  Negative for shortness of breath.   Cardiovascular:  Negative for chest pain.  Gastrointestinal:  Negative for abdominal pain, nausea and vomiting.    Physical Exam Updated Vital Signs BP (!) 118/91   Pulse 99   Temp (!) 97.5 F (36.4 C) (Oral)   Resp 18   SpO2 94%  Physical Exam Constitutional:      General: He is not in acute distress.    Appearance: Normal appearance. He is not ill-appearing or toxic-appearing.     Comments: Awake, joking with police officer, laughing.  Eyes:     General: No scleral icterus. Cardiovascular:     Rate and Rhythm: Normal rate.  Pulmonary:     Effort: Pulmonary effort is normal. No respiratory distress.     Breath sounds: Normal breath sounds.  Skin:    General: Skin is dry.  Neurological:  General: No focal deficit present.     Mental Status: He is alert and oriented to person, place, and time. Mental status is at baseline.  Psychiatric:        Mood and Affect: Mood normal.     ED Results / Procedures / Treatments   Labs (all labs ordered are listed, but only abnormal results are displayed) Labs Reviewed  CBC WITH DIFFERENTIAL/PLATELET - Abnormal; Notable for the following components:      Result Value   WBC 10.7 (*)    RBC 4.00 (*)    Hemoglobin 12.2 (*)    HCT 36.1 (*)    Platelets 413 (*)    Eosinophils Absolute 0.6 (*)    All other components within normal  limits  COMPREHENSIVE METABOLIC PANEL - Abnormal; Notable for the following components:   Glucose, Bld 112 (*)    Calcium 8.3 (*)    All other components within normal limits  ACETAMINOPHEN LEVEL - Abnormal; Notable for the following components:   Acetaminophen (Tylenol), Serum <10 (*)    All other components within normal limits  SALICYLATE LEVEL - Abnormal; Notable for the following components:   Salicylate Lvl <7.0 (*)    All other components within normal limits  ACETAMINOPHEN LEVEL - Abnormal; Notable for the following components:   Acetaminophen (Tylenol), Serum <10 (*)    All other components within normal limits    EKG None  Radiology No results found.  Procedures Procedures   Medications Ordered in ED Medications  naloxone [facility]) injection 0.4 mg (0.4 mg Intravenous Given 06/18/22 0307)  naloxone [facility]) injection 0.4 mg (0.4 mg Intravenous Given 06/18/22 9528)    ED Course/ Medical Decision Making/ A&P                           Medical Decision Making Amount and/or Complexity of Data Reviewed Labs: ordered.  Risk Prescription drug management.   37 y.o. male presents to the ER today for evaluation of OD. Differential diagnosis includes but is not limited to heroin OD, Fent OD, Opiod OD, Tylenol toxicity, electrolyte abnormality. Vital signs show BP 102/75,  . Physical exam as noted above.   Given the patient reports took Vicodin, will order Tylenol level given that he required Narcan.  Could be possibly more than 2-3 Vicodin like he mentioned previously.  I independently reviewed and interpreted the patient's labs.  CBC shows mild leukocytosis at 10.7 with out a left shift.  Could be stress reaction.  Hemoglobin of 12.2.  He has a negative Tylenol and salicylate level.  Repeat again was a negative Tylenol level.  CMP shows glucose at 112 and decreased calcium level 8.3.  No other electrolyte or LFT abnormality.  Patient required Narcan with EMS and  drastically improved per [employer].  He was talkative and interactive with both the police officer and myself.  He began to drop his heart rate into the 40s and 50s around 2 hours later around 0300 and required second dose of Narcan here.  Will evaluate for 3 hours to see if he needs redose of medications.   On re-valuation, the patient is sleeping, maintaining airway. He awakens to voice and is oriented x3.   Around 0600, the patient started to brady down again, but was maintaining his airway. Re-dose with Narcan. Discussed with my attending about narcan drip and admission versus re-dose and watch. Will continue to monitor the patient after his third dose and hand off  to oncoming shift.   7:06 AM Care of Brendan Sutton  transferred to [facility] at the end of my shift as the patient will require reassessment once labs/imaging have resulted. Patient presentation, ED course, and plan of care discussed with review of all pertinent labs and imaging. Please see his/her note for further details regarding further ED course and disposition. Plan at time of handoff is consistently re-evaluated to see if he needs re-dosing of narcan. If need for additional intervention, would admit. This may be altered or completely changed at the discretion of the oncoming team pending results of further workup.  Portions of this report may have been transcribed using voice recognition software. Every effort was made to ensure accuracy; however, inadvertent computerized transcription errors may be present.    Final Clinical Impression(s) / ED Diagnoses Final diagnoses:  None    Rx / DC Orders ED Discharge Orders     None         Achille Rich, PA-C 06/18/22 0981    Nira Conn, MD 06/18/22 [phone]

## 2022-06-18 NOTE — ED Provider Notes (Signed)
Patient given in sign out by Achille Rich, PA-C.  Please review their note for patient HPI, physical exam, workup.  At this time the plan is to reevaluate at 9:30 AM to see if patient is able to be discharged or needs to be admitted and consider Narcan drip.  9:30 AM patient was reevaluated.  Patient's vitals are stable and patient was easily arousable.  Patient states he feels fine and does not feel he needs to be admitted.  There is no clinical indication for starting a Narcan drip or giving patient a more Narcan as his vitals are fine and patient is not endorsing any symptoms.  Patient states he is fine with being discharged.  Patient be discharged into police custody.  Patient stable for discharge at this time given return precautions.    Netta Corrigan, PA-C 06/18/22 0931    Nira Conn, MD 06/19/22 [phone]

## 2022-06-18 NOTE — ED Triage Notes (Signed)
Patient arrives via [facility] after being found in his car, patient lethargic with EMS admits to heroin use. Given 1mg  narcan IN prior to arrival.

## 2022-08-30 ENCOUNTER — Other Ambulatory Visit: Payer: Self-pay

## 2022-08-30 ENCOUNTER — Emergency Department (HOSPITAL_COMMUNITY)
Admission: EM | Admit: 2022-08-30 | Discharge: 2022-08-30 | Disposition: A | Discharge status: Home / Self Care | Payer: 59 | Attending: Emergency Medicine | Admitting: Emergency Medicine

## 2022-08-30 ENCOUNTER — Emergency Department (HOSPITAL_COMMUNITY): Payer: 59

## 2022-08-30 ENCOUNTER — Encounter (HOSPITAL_COMMUNITY): Payer: Self-pay | Admitting: Emergency Medicine

## 2022-08-30 DIAGNOSIS — M79672 Pain in left foot: Secondary | ICD-10-CM | POA: Insufficient documentation

## 2022-08-30 DIAGNOSIS — M7989 Other specified soft tissue disorders: Secondary | ICD-10-CM | POA: Insufficient documentation

## 2022-08-30 DIAGNOSIS — K0889 Other specified disorders of teeth and supporting structures: Secondary | ICD-10-CM | POA: Insufficient documentation

## 2022-08-30 DIAGNOSIS — M79671 Pain in right foot: Secondary | ICD-10-CM | POA: Insufficient documentation

## 2022-08-30 MED ORDER — AMOXICILLIN-POT CLAVULANATE 875-125 MG PO TABS
1.0000 | ORAL_TABLET | Freq: Once | ORAL | Status: AC
  Administered 2022-08-30: 1 via ORAL
  Filled 2022-08-30: qty 1

## 2022-08-30 MED ORDER — AMOXICILLIN-POT CLAVULANATE 875-125 MG PO TABS: 1.0000 | ORAL_TABLET | Freq: Two times a day (BID) | ORAL | 0 refills | Status: DC

## 2022-08-30 MED ORDER — ACETAMINOPHEN 325 MG PO TABS
650.0000 mg | ORAL_TABLET | Freq: Once | ORAL | Status: AC | PRN
  Administered 2022-08-30: 650 mg via ORAL
  Filled 2022-08-30: qty 2

## 2022-08-30 NOTE — ED Triage Notes (Signed)
Pt reports recent foot swelling bilaterally that improved with rest and elevation.  Reports dental pain and abscesses and would like to be referred to a dentist.  Pt reports having extensive lab work done recently while incarcerated and has a diagnosis of Hep C

## 2022-08-30 NOTE — Discharge Instructions (Addendum)
Please take Augmentin twice daily for a week  Please call dentist tomorrow for appointment  Call wellness center to establish care with a primary care doctor  Return to ER if you have worse dental pain, trouble swallowing

## 2022-08-30 NOTE — ED Provider Notes (Signed)
[place] EMERGENCY DEPARTMENT AT [facility] Provider Note   CSN: [id number] Arrival date & time: 08/30/22  1617     History  Chief Complaint  Patient presents with   Dental Pain   Foot Swelling    Brendan Sutton is a 37 y.o. male.  Patient is a 37 year old male presented today for bilateral foot pain as well as dental pain.  He states he has had dental pain with an abscesses forming over the last 2 to 3 years.  He notes that he had a large abscess earlier this week which he popped and drained, it is since started to improve.  He denies any fevers or chills.  He denies any facial swelling.  He is requesting assistance with following up with dentistry.  He has no difficulty speaking, eating, or swallowing.  He also complains of bilateral feet pain.  He denies any falls or injuries.  He has noticed occasional swelling after walking for a long time, but states that is also now improved.  He has not taken anything for his pain.  He denies any numbness or tingling.  He does state that the worst pain is the first step of the day.     Home Medications Prior to Admission medications   Medication Sig Start Date End Date Taking? Authorizing Provider  amoxicillin-clavulanate (AUGMENTIN) 875-125 MG tablet Take 1 tablet by mouth every 12 (twelve) hours. 08/30/22  Yes Charlynne Pander, MD  benzonatate (TESSALON) 100 MG capsule Take 1 capsule (100 mg total) by mouth every 8 (eight) hours. Patient not taking: Reported on 09/01/2017 11/27/13   Shirleen Schirmer, PA-C  Cetirizine HCl (ZYRTEC ALLERGY) 10 MG CAPS Take 1 capsule (10 mg total) by mouth at bedtime. Patient not taking: Reported on 09/01/2017 11/27/13   Shirleen Schirmer, PA-C  clindamycin (CLEOCIN) 300 MG capsule Take 1 capsule (300 mg total) by mouth 4 (four) times daily. X 7 days 06/15/22   Geoffery Lyons, MD  fluticasone [facility]) 50 MCG/ACT nasal spray Place 2 sprays into both nostrils daily. Patient not taking: Reported on  09/01/2017 11/27/13   Shirleen Schirmer, PA-C  HYDROcodone-acetaminophen [facility]) 5-325 MG tablet Take 1-2 tablets by mouth every 6 (six) hours as needed. Patient taking differently: Take 1-2 tablets by mouth every 6 (six) hours as needed for moderate pain or severe pain. 06/15/22   Geoffery Lyons, MD  ibuprofen (ADVIL,MOTRIN) 400 MG tablet Take 1 tablet (400 mg total) by mouth every 8 (eight) hours as needed. Patient not taking: Reported on 09/01/2017 01/19/14   Sciacca, Marissa, PA-C  lidocaine (XYLOCAINE) 2 % solution Use as directed 20 mLs in the mouth or throat as needed for mouth pain. Patient not taking: Reported on 09/01/2017 11/27/13   Shirleen Schirmer, PA-C      Allergies    Patient has no known allergies.    Review of Systems   Review of Systems Negative except for as noted above in HPI  Physical Exam Updated Vital Signs BP (!) 140/93 (BP Location: Right Arm)   Pulse 76   Temp (!) 97.3 F (36.3 C) (Oral)   Resp 17   SpO2 96%  Physical Exam Vitals and nursing note reviewed.  Constitutional:      General: He is not in acute distress.    Appearance: He is well-developed.  HENT:     Head: Normocephalic and atraumatic.     Nose: No rhinorrhea.     Mouth/Throat:     Mouth: Mucous membranes are moist.  Comments: Posterior right mandibular molar is broken without any evidence of surrounding swelling or drainage.  No obvious abscess noted throughout the mouth.  Uvula is midline. Eyes:     Conjunctiva/sclera: Conjunctivae normal.  Cardiovascular:     Rate and Rhythm: Normal rate and regular rhythm.     Heart sounds: No murmur heard. Pulmonary:     Effort: Pulmonary effort is normal. No respiratory distress.     Breath sounds: Normal breath sounds.     Comments: Saturating well on room air Abdominal:     General: Abdomen is flat.  Musculoskeletal:        General: No swelling.     Cervical back: Neck supple.     Comments: Tenderness noted bilaterally over the mid fourth and  fifth metatarsals.  He has 2+ pedal pulses.  He has full range of motion.  Sensation is intact throughout.  No swelling or color change is noted.  Skin:    General: Skin is warm and dry.     Capillary Refill: Capillary refill takes less than 2 seconds.  Neurological:     Mental Status: He is alert and oriented to person, place, and time.     Sensory: No sensory deficit.     Motor: No weakness.  Psychiatric:        Mood and Affect: Mood normal.     ED Results / Procedures / Treatments   Labs (all labs ordered are listed, but only abnormal results are displayed) Labs Reviewed - No data to display  EKG None  Radiology DG Foot 2 Views Left  Result Date: 08/30/2022 CLINICAL DATA:  Foot pain EXAM: LEFT FOOT - 2 VIEW COMPARISON:  None Available. FINDINGS: There is no evidence of fracture or dislocation. There is no evidence of arthropathy or other focal bone abnormality. Soft tissues are unremarkable. IMPRESSION: Negative. Electronically Signed   By: Darliss Cheney M.D.   On: 08/30/2022 21:06   DG Foot 2 Views Right  Result Date: 08/30/2022 CLINICAL DATA:  Foot pain EXAM: RIGHT FOOT - 2 VIEW COMPARISON:  None Available. FINDINGS: There is no evidence of fracture or dislocation. There is no evidence of arthropathy or other focal bone abnormality. Soft tissues are unremarkable. IMPRESSION: Negative. Electronically Signed   By: Jasmine Pang M.D.   On: 08/30/2022 21:01      Medications Ordered in ED Medications  acetaminophen (TYLENOL) tablet 650 mg (650 mg Oral Given 08/30/22 1720)  amoxicillin-clavulanate (AUGMENTIN) 875-125 MG per tablet 1 tablet (1 tablet Oral Given 08/30/22 1918)    ED Course/ Medical Decision Making/ A&P                                Medical Decision Making Problems Addressed: Foot pain, bilateral: acute illness or injury Pain, dental: complicated acute illness or injury  Amount and/or Complexity of Data Reviewed External Data Reviewed: notes. Radiology:  ordered and independent interpretation performed.  Risk OTC drugs. Prescription drug management.   Patient is a 37 year old male presented today for bilateral foot pain as well as dental pain.  On exam, patient is alert and oriented.  He is speaking without difficulty.  He does have evidence of poor dentition and broken teeth but no obvious abscess or drainage appreciated on exam.  As for his feet, he has 2+ pedal pulses bilaterally.  He has no evidence of swelling or color change.  Regarding his dentition, presentation consistent with chronic  dental caries, however concern for possible infection or abscess as he was noting drainage earlier in the week.  Do not suspect Ludwig's angina, PTA, or RPA based on exam.  Regarding his feet, will obtain x-rays to rule out fracture or malalignment, however lower suspicion for this based on exam.  Presentation more consistent with sprain, strain, overuse injury, versus plantar fasciitis.  Patient given Augmentin for coverage of possible dental infection as well as Tylenol for pain.  X-ray of the feet obtained and negative for any acute abnormalities.  Suspect plantar fasciitis versus tendinitis.  Will advise over-the-counter NSAIDs, plantar fasciitis exercises, RICE, and follow-up with primary care.  Patient is appropriate for discharge at this time.  Provided with prescription for Augmentin and dental resources.  Provided with return precautions.  He states understanding and agreement with plan for discharge at this time.   Final Clinical Impression(s) / ED Diagnoses Final diagnoses:  Pain, dental  Foot pain, bilateral    Rx / DC Orders ED Discharge Orders          Ordered    amoxicillin-clavulanate (AUGMENTIN) 875-125 MG tablet  Every 12 hours        08/30/22 2129              Rhys Martini, DO 08/31/22 0020    Charlynne Pander, MD 09/05/22 [phone]

## 2022-09-07 ENCOUNTER — Telehealth: Payer: Self-pay

## 2022-09-07 NOTE — Telephone Encounter (Signed)
Transition Care Management Unsuccessful Follow-up Telephone Call  Date of discharge and from where:  08/30/2022 [facility]  Attempts:  1st Attempt  Reason for unsuccessful TCM follow-up call:  Left voice message  Briyan Kleven [facility]  [facility] Population Health Community Resource Care Guide   [email or web address]  ?? [phone]   Website: [email or web address]  [email or web address]

## 2022-09-11 ENCOUNTER — Telehealth: Payer: Self-pay

## 2022-09-11 NOTE — Telephone Encounter (Signed)
Transition Care Management Unsuccessful Follow-up Telephone Call  Date of discharge and from where:  08/30/2022 [facility]  Attempts:  2nd Attempt  Reason for unsuccessful TCM follow-up call:  Left voice message  [name] [facility]  [facility] Population Health Community Resource Care Guide   [email or web address]  ?? [phone]   Website: [email or web address]  [email or web address]

## 2022-11-18 ENCOUNTER — Encounter (HOSPITAL_COMMUNITY): Payer: Self-pay

## 2022-11-18 ENCOUNTER — Emergency Department (HOSPITAL_COMMUNITY): Payer: 59

## 2022-11-18 ENCOUNTER — Observation Stay (HOSPITAL_COMMUNITY)
Admission: EM | Admit: 2022-11-18 | Discharge: 2022-11-18 | Disposition: A | Discharge status: Home / Self Care | Payer: 59 | Attending: Family Medicine | Admitting: Family Medicine

## 2022-11-18 ENCOUNTER — Other Ambulatory Visit: Payer: Self-pay

## 2022-11-18 ENCOUNTER — Observation Stay (HOSPITAL_BASED_OUTPATIENT_CLINIC_OR_DEPARTMENT_OTHER)
Admission: EM | Admit: 2022-11-18 | Discharge: 2022-11-21 | Disposition: A | Discharge status: Home / Self Care | Payer: 59 | Source: Home / Self Care | Attending: Emergency Medicine | Admitting: Emergency Medicine

## 2022-11-18 DIAGNOSIS — F17210 Nicotine dependence, cigarettes, uncomplicated: Secondary | ICD-10-CM | POA: Insufficient documentation

## 2022-11-18 DIAGNOSIS — K810 Acute cholecystitis: Secondary | ICD-10-CM | POA: Diagnosis present

## 2022-11-18 DIAGNOSIS — K819 Cholecystitis, unspecified: Principal | ICD-10-CM

## 2022-11-18 DIAGNOSIS — Z79899 Other long term (current) drug therapy: Secondary | ICD-10-CM | POA: Diagnosis not present

## 2022-11-18 DIAGNOSIS — R1011 Right upper quadrant pain: Secondary | ICD-10-CM | POA: Insufficient documentation

## 2022-11-18 DIAGNOSIS — E876 Hypokalemia: Secondary | ICD-10-CM | POA: Insufficient documentation

## 2022-11-18 DIAGNOSIS — Z8619 Personal history of other infectious and parasitic diseases: Secondary | ICD-10-CM | POA: Insufficient documentation

## 2022-11-18 DIAGNOSIS — F1910 Other psychoactive substance abuse, uncomplicated: Secondary | ICD-10-CM | POA: Diagnosis not present

## 2022-11-18 DIAGNOSIS — K8012 Calculus of gallbladder with acute and chronic cholecystitis without obstruction: Secondary | ICD-10-CM | POA: Insufficient documentation

## 2022-11-18 DIAGNOSIS — F1123 Opioid dependence with withdrawal: Secondary | ICD-10-CM | POA: Insufficient documentation

## 2022-11-18 DIAGNOSIS — H5461 Unqualified visual loss, right eye, normal vision left eye: Secondary | ICD-10-CM | POA: Insufficient documentation

## 2022-11-18 DIAGNOSIS — R10811 Right upper quadrant abdominal tenderness: Secondary | ICD-10-CM | POA: Diagnosis present

## 2022-11-18 DIAGNOSIS — F1193 Opioid use, unspecified with withdrawal: Secondary | ICD-10-CM | POA: Insufficient documentation

## 2022-11-18 DIAGNOSIS — K811 Chronic cholecystitis: Principal | ICD-10-CM | POA: Insufficient documentation

## 2022-11-18 DIAGNOSIS — R9431 Abnormal electrocardiogram [ECG] [EKG]: Secondary | ICD-10-CM | POA: Diagnosis not present

## 2022-11-18 DIAGNOSIS — Z8614 Personal history of Methicillin resistant Staphylococcus aureus infection: Secondary | ICD-10-CM | POA: Diagnosis not present

## 2022-11-18 DIAGNOSIS — K8020 Calculus of gallbladder without cholecystitis without obstruction: Secondary | ICD-10-CM | POA: Diagnosis not present

## 2022-11-18 LAB — COMPREHENSIVE METABOLIC PANEL
ALT: 30 U/L (ref 0–44)
AST: 16 U/L (ref 15–41)
Albumin: 3.3 g/dL — ABNORMAL LOW (ref 3.5–5.0)
Alkaline Phosphatase: 65 U/L (ref 38–126)
Anion gap: 8 (ref 5–15)
BUN: 15 mg/dL (ref 6–20)
CO2: 25 mmol/L (ref 22–32)
Calcium: 9 mg/dL (ref 8.9–10.3)
Chloride: 101 mmol/L (ref 98–111)
Creatinine, Ser: 0.92 mg/dL (ref 0.61–1.24)
GFR, Estimated: >60 mL/min (ref >60)
Glucose, Bld: 127 mg/dL — ABNORMAL HIGH (ref 70–99)
Potassium: 3 mmol/L — ABNORMAL LOW (ref 3.5–5.1)
Sodium: 134 mmol/L — ABNORMAL LOW (ref 135–145)
Total Bilirubin: 0.3 mg/dL (ref <1.2)
Total Protein: 6.8 g/dL (ref 6.5–8.1)

## 2022-11-18 LAB — RAPID URINE DRUG SCREEN, HOSP PERFORMED
Amphetamines: NOT DETECTED
Barbiturates: NOT DETECTED
Benzodiazepines: NOT DETECTED
Cocaine: NOT DETECTED
Opiates: NOT DETECTED
Tetrahydrocannabinol: POSITIVE — AB

## 2022-11-18 LAB — CBC
HCT: 40.9 % (ref 39.0–52.0)
Hemoglobin: 13.3 g/dL (ref 13.0–17.0)
MCH: 27.7 pg (ref 26.0–34.0)
MCHC: 32.5 g/dL (ref 30.0–36.0)
MCV: 85.2 fL (ref 80.0–100.0)
Platelets: 472 10*3/uL — ABNORMAL HIGH (ref 150–400)
RBC: 4.8 MIL/uL (ref 4.22–5.81)
RDW: 13.3 % (ref 11.5–15.5)
WBC: 16 10*3/uL — ABNORMAL HIGH (ref 4.0–10.5)
nRBC: 0 % (ref 0.0–0.2)

## 2022-11-18 LAB — URINALYSIS, ROUTINE W REFLEX MICROSCOPIC
Bilirubin Urine: NEGATIVE
Glucose, UA: NEGATIVE mg/dL
Hgb urine dipstick: NEGATIVE
Ketones, ur: NEGATIVE mg/dL
Leukocytes,Ua: NEGATIVE
Nitrite: NEGATIVE
Protein, ur: NEGATIVE mg/dL
Specific Gravity, Urine: 1.025 (ref 1.005–1.030)
pH: 6 (ref 5.0–8.0)

## 2022-11-18 LAB — LIPASE, BLOOD: Lipase: 27 U/L (ref 11–51)

## 2022-11-18 MED ORDER — POLYETHYLENE GLYCOL 3350 17 G PO PACK: 17.0000 g | PACK | Freq: Every day | ORAL | Status: DC | PRN

## 2022-11-18 MED ORDER — PIPERACILLIN-TAZOBACTAM 3.375 G IVPB: 3.3750 g | Freq: Three times a day (TID) | INTRAVENOUS | Status: DC

## 2022-11-18 MED ORDER — KETOROLAC TROMETHAMINE 30 MG/ML IJ SOLN: 30.0000 mg | Freq: Four times a day (QID) | INTRAMUSCULAR | Status: DC | PRN

## 2022-11-18 MED ORDER — BUPRENORPHINE HCL-NALOXONE HCL 8-2 MG SL SUBL
2.0000 | SUBLINGUAL_TABLET | Freq: Every day | SUBLINGUAL | Status: DC
  Administered 2022-11-18: 2 via SUBLINGUAL
  Filled 2022-11-18: qty 2

## 2022-11-18 MED ORDER — FAMOTIDINE IN NACL 20-0.9 MG/50ML-% IV SOLN
20.0000 mg | Freq: Once | INTRAVENOUS | Status: AC
  Administered 2022-11-18: 20 mg via INTRAVENOUS
  Filled 2022-11-18: qty 50

## 2022-11-18 MED ORDER — ONDANSETRON HCL 4 MG PO TABS: 4.0000 mg | ORAL_TABLET | Freq: Four times a day (QID) | ORAL | Status: DC | PRN

## 2022-11-18 MED ORDER — POTASSIUM CHLORIDE CRYS ER 20 MEQ PO TBCR
20.0000 meq | EXTENDED_RELEASE_TABLET | Freq: Once | ORAL | Status: AC
  Administered 2022-11-18: 20 meq via ORAL
  Filled 2022-11-18: qty 1

## 2022-11-18 MED ORDER — ONDANSETRON HCL 4 MG/2ML IJ SOLN
4.0000 mg | Freq: Once | INTRAMUSCULAR | Status: AC
  Administered 2022-11-18: 4 mg via INTRAVENOUS
  Filled 2022-11-18: qty 2

## 2022-11-18 MED ORDER — POTASSIUM CHLORIDE 10 MEQ/100ML IV SOLN
10.0000 meq | Freq: Once | INTRAVENOUS | Status: AC
  Administered 2022-11-18: 10 meq via INTRAVENOUS
  Filled 2022-11-18: qty 100

## 2022-11-18 MED ORDER — ONDANSETRON HCL 4 MG/2ML IJ SOLN: 4.0000 mg | Freq: Four times a day (QID) | INTRAMUSCULAR | Status: DC | PRN

## 2022-11-18 MED ORDER — ACETAMINOPHEN 10 MG/ML IV SOLN: 1000.0000 mg | Freq: Four times a day (QID) | INTRAVENOUS | Status: DC | PRN

## 2022-11-18 MED ORDER — KETOROLAC TROMETHAMINE 30 MG/ML IJ SOLN
30.0000 mg | Freq: Once | INTRAMUSCULAR | Status: AC
  Administered 2022-11-18: 30 mg via INTRAVENOUS
  Filled 2022-11-18: qty 1

## 2022-11-18 MED ORDER — PIPERACILLIN-TAZOBACTAM 3.375 G IVPB 30 MIN: 3.3750 g | Freq: Once | INTRAVENOUS | Status: DC

## 2022-11-18 NOTE — ED Triage Notes (Signed)
Pt returns to ED after leaving AMA. Pt states that He wanted to lock all of his belonging up in car and then come back. Pt reportedly here for gallbladder removal. No recent changes since leaving

## 2022-11-18 NOTE — ED Notes (Signed)
Pt refused to sign AMA papers.  Both RN and provider explained AMA situation in full.

## 2022-11-18 NOTE — ED Notes (Signed)
Pt called for triage, no response. 

## 2022-11-18 NOTE — ED Triage Notes (Signed)
Pt with no answer when called from waiting room

## 2022-11-18 NOTE — H&P (Cosign Needed)
Hospital Admission History and Physical Service Pager: [phone]  Patient name: Brendan Sutton Medical record number: [id number] Date of Birth: 04/02/1985 Age: 37 y.o. Gender: male  Primary Care Provider: Patient, No Pcp Per Consultants: general surgery Code Status: FULL Preferred Emergency Contact:  Contact Information     Name Relation Home Work Mobile   Stoddard Mother [phone]  [phone]      Other Contacts   None on File      Chief Complaint: heroin withdrawal, abdominal pain  Assessment and Plan: Brendan Sutton is a 37 y.o. male PMH of right eye blindness, heroin use presenting with heroin withdrawal and cholecystitis.  Shortly after evaluating patient, around 8:23 PM, notified by ED provider that patient signed out AMA to go lock his car.  When myself and Brendan Sutton went to evaluate him again, the room was already empty.  See AMA consent obtained in Brendan Sutton note.   Assessment & Plan Cholecystitis Right upper quadrant ultrasound with positive sonographic Murphy sign and cholelithiasis.  General surgery consulted by ED provider and plan to perform cholecystectomy in the morning.  Started on IV Zosyn in the ED and given Toradol for pain, along with Pepcid and Zofran for nausea. -Admitted to [facility], Dr. Deirdre Priest attending -General Surgery on board, cholecystectomy tomorrow -N.p.o. -Tylenol 1000 mg IV every 6 hours as needed -Toradol 30 mg every 6 hours IV as needed -Zofran 4 mg every 6 hours as needed -Continue Zosyn -SCDs Heroin withdrawal (HCC) Last used 4 days ago.  Has been intermittently taking his girlfriends Subutex over the last 3 days, and took an 8 mg Suboxone pill yesterday.  S/p Suboxone 8-2mg  2 tablet in the ED. -suboxone 8-2mg  2 tablet daily -COWS q4h -analgesic and antiemetic as above -TOC consult for substance use counseling Hypokalemia due to excessive gastrointestinal loss of potassium K 3.0 in the ED, s/p  Kcl -am BMP, Mag   Chronic and Stable Problems: None   FEN/GI: NPO VTE Prophylaxis: SCDs  Disposition: inpatient status, med surg  History of Present Illness:  Brendan Sutton is a 37 y.o. male presenting with abdominal pain and withdrawal symptoms.  States that he used heroin 4 days ago.  Began developing withdrawal symptoms the next day, fever, chills, nausea, vomiting, abdominal pain.  States the abdominal pain was worse than his typical withdrawals.  Reports he used his girlfriends Subutex strips starting 3 days ago, and then took one 8 mg Suboxone pill yesterday.  States last bowel movement 4 days ago but also reports diarrhea.  Was able to eat food earlier today.  Denies chest pain, shortness of breath.  In the ED, pt had RUQ ultrasound with cholelithiasis and positive sonographic Murphy's sign, leukocytosis at 16. Hypokalemic at 3.0. ED Provider consulted General Surgery who plan for cholecystectomy but would like pt admitted to our service due to opiate withdrawal.   Review Of Systems: Per HPI with the following additions: none  Pertinent Past Medical History: None Remainder reviewed in history tab.   Pertinent Past Surgical History: Surgery to R eye  Remainder reviewed in history tab.   Pertinent Social History: Tobacco use: Yes 0.5 PPD 20 years Alcohol use: No use Other Substance use: Heroin, marijuana Lives with wife  Pertinent Family History: None  Remainder reviewed in history tab.   Important Outpatient Medications: None  Remainder reviewed in medication history.   Objective: BP (!) 105/55   Pulse 77   Temp 98.4 F (36.9 C)  Resp 17   Ht 5\' 9"  (1.753 m)   Wt 70.8 kg   SpO2 96%   BMI 23.04 kg/m  Exam: General: No acute distress, resting in bed with his girlfriend Eyes: Blind in right eye Cardiovascular: Regular rate and rhythm, no murmurs Respiratory: Clear to auscultation bilaterally, no increased work of breathing on room  air Gastrointestinal: Normal bowel sounds.  Diffuse tenderness to palpation over abdomen.  Worse in right upper quadrant.  No distention. MSK: Track marks noted over right hand.  No swelling bilateral lower extremities. Neuro: Alert and oriented x 4 Psych: Normal affect  Labs:  CBC BMET  Recent Labs  Lab 11/18/22 1614  WBC 16.0*  HGB 13.3  HCT 40.9  PLT 472*   Recent Labs  Lab 11/18/22 1614  NA 134*  K 3.0*  CL 101  CO2 25  BUN 15  CREATININE 0.92  GLUCOSE 127*  CALCIUM 9.0    U/A unremarkable UDS positive THC Lipase 27  EKG:  NSR at 79BPM. Qtc . No ST elevation or T wave inversions.     Imaging Studies Performed: [place] RUQ 11/18/22 IMPRESSION: 1. Cholelithiasis without secondary signs of acute cholecystitis. Sonographer reports a positive sonographic Murphy's sign. If there is persistent clinical concern for acute cholecystitis, recommend further evaluation with HIDA scan. 2. Increased hepatic parenchymal echogenicity suggestive of steatosis.  I independently reviewed and agree with radiologist's impression of imaging study.   Khandi Kernes, Tamala Julian, DO 11/18/2022, 8:37 PM PGY-1, [facility]  FPTS Intern pager: [phone], text pages welcome Secure chat group [facility] Teaching Service

## 2022-11-18 NOTE — ED Triage Notes (Signed)
Pt arrives with c/o going through withdrawals from heroin. Per pt, the last time he used was 4 days ago. Pt endorses headache, tremors, ABD pain, and n/v. Pt a&ox4 and ambulatory to triage at this time.

## 2022-11-18 NOTE — ED Notes (Signed)
Pt asked this RN to unhook him from his IV so that he could go out to his car to lock it up. Pts girlfriend was at bedside at this time. After checking with Charge RN and MD, I then educated pt that he would not be able to go to car with IV in tact. A few min later, pt IV pump was beeping that the potassium infusion was complete, the infusion was in fact NOT complete. The pt wanted his IV unhooked because the pt said that his potassium was complete, this RN educated the pt that the potassium was not complete and showed the pt the medication that remained in the bag. Pt then became very aggressive yelling vulgarities and telling this RN that he was getting ready to flip out on this nurse and hurt this RN. This RN at that point asked the desk to call security. Charge RN had already been asked to come discuss the pt going out to car with pt, So Press photographer also came, PA as well had been asked to come discuss issue with pt wanting to go out to car and also came along with security and charge RN

## 2022-11-18 NOTE — Hospital Course (Signed)
Heroin w/d 4 days ago Right upper quad tenderness U/s +  Gen surg---admit to medicine for heroin w/d and surgery

## 2022-11-18 NOTE — ED Notes (Signed)
Patient transported to Ultrasound 

## 2022-11-18 NOTE — ED Notes (Signed)
Charge RN called to room.  Pt upset b/c he wanted to go lock his car and return.  RN explained that if he choose to leave the ED even for a minute to lock his car he would have to have his IV removed and have to restart the process for admission.  Pt agreed.  RN remove

## 2022-11-18 NOTE — ED Provider Notes (Addendum)
[place] EMERGENCY DEPARTMENT AT [facility] Provider Note   CSN: [id number] Arrival date & time: 11/18/22  1537     History  Chief Complaint  Patient presents with   Withdrawal    Brendan Sutton is a 37 y.o. male history of IV heroin use presented for heroin withdrawal.  Patient states he last used 4 days ago and since then has been having nausea vomiting with subjective fevers and chills and generalized bodyaches.  Patient states he is also having right upper quadrant abdominal pain which is different than when he normally has opiate withdrawal.  Patient states he has been previously on Suboxone or methadone but does not know which 1 but was on one of them and states that he would like to be referred to clinic to help with heroin withdrawal long-term.  Patient denies chest pain, shortness of breath, back pain, dysuria, change in sensation of motor skills, new onset weakness  Home Medications Prior to Admission medications   Not on File      Allergies    Patient has no known allergies.    Review of Systems   Review of Systems  Physical Exam Updated Vital Signs BP 129/72   Pulse 71   Temp 98.4 F (36.9 C)   Resp 17   Ht 5\' 9"  (1.753 m)   Wt 70.8 kg   SpO2 97%   BMI 23.04 kg/m  Physical Exam Vitals reviewed.  Constitutional:      General: He is not in acute distress.    Comments: Appears uncomfortable  HENT:     Head: Normocephalic and atraumatic.  Eyes:     Extraocular Movements: Extraocular movements intact.     Conjunctiva/sclera: Conjunctivae normal.     Pupils: Pupils are equal, round, and reactive to light.  Cardiovascular:     Rate and Rhythm: Normal rate and regular rhythm.     Pulses: Normal pulses.     Heart sounds: Normal heart sounds.     Comments: 2+ bilateral radial/dorsalis pedis pulses with regular rate Pulmonary:     Effort: Pulmonary effort is normal. No respiratory distress.     Breath sounds: Normal breath sounds.   Abdominal:     Palpations: Abdomen is soft.     Tenderness: There is abdominal tenderness (Right upper quadrant). There is no guarding or rebound.  Musculoskeletal:        General: Normal range of motion.     Cervical back: Normal range of motion and neck supple.     Comments: 5 out of 5 bilateral grip/leg extension strength  Skin:    General: Skin is warm and dry.     Capillary Refill: Capillary refill takes less than 2 seconds.  Neurological:     General: No focal deficit present.     Mental Status: He is alert and oriented to person, place, and time.     Comments: Sensation intact in all 4 limbs  Psychiatric:        Mood and Affect: Mood normal.     ED Results / Procedures / Treatments   Labs (all labs ordered are listed, but only abnormal results are displayed) Labs Reviewed  COMPREHENSIVE METABOLIC PANEL - Abnormal; Notable for the following components:      Result Value   Sodium 134 (*)    Potassium 3.0 (*)    Glucose, Bld 127 (*)    Albumin 3.3 (*)    All other components within normal limits  CBC -  Abnormal; Notable for the following components:   WBC 16.0 (*)    Platelets 472 (*)    All other components within normal limits  RAPID URINE DRUG SCREEN, HOSP PERFORMED - Abnormal; Notable for the following components:   Tetrahydrocannabinol POSITIVE (*)    All other components within normal limits  LIPASE, BLOOD  URINALYSIS, ROUTINE W REFLEX MICROSCOPIC  MAGNESIUM    EKG EKG Interpretation Date/Time:  Sunday November 18 2022 16:01:29 EST Ventricular Rate:  79 PR Interval:  130 QRS Duration:  94 QT Interval:  380 QTC Calculation: 435 R Axis:   76  Text Interpretation: Normal sinus rhythm Normal ECG When compared with ECG of 18-Jun-2022 01:07, PREVIOUS ECG IS PRESENT Confirmed by Alvester Chou [phone]) on 11/18/2022 4:18:36 PM  Radiology US Abdomen Limited RUQ (LIVER/GB)  Result Date: 11/18/2022 CLINICAL DATA:  Right upper quadrant pain EXAM: ULTRASOUND  ABDOMEN LIMITED RIGHT UPPER QUADRANT COMPARISON:  None Available. FINDINGS: Gallbladder: Large stone in the gallbladder lumen. No gallbladder wall thickening or pericholecystic fluid. Sonographer reports a positive sonographic Murphy's sign. Common bile duct: Diameter: 4 mm Liver: Increased echogenicity. No focal lesion. Portal vein is patent on color Doppler imaging with normal direction of blood flow towards the liver. Other: None. IMPRESSION: 1. Cholelithiasis without secondary signs of acute cholecystitis. Sonographer reports a positive sonographic Murphy's sign. If there is persistent clinical concern for acute cholecystitis, recommend further evaluation with HIDA scan. 2. Increased hepatic parenchymal echogenicity suggestive of steatosis. Electronically Signed   By: Annia Belt M.D.   On: 11/18/2022 18:15    Procedures .Critical Care  Performed by: Netta Corrigan, PA-C Authorized by: Netta Corrigan, PA-C   Critical care provider statement:    Critical care time (minutes):  30   Critical care time was exclusive of:  Separately billable procedures and treating other patients   Critical care was necessary to treat or prevent imminent or life-threatening deterioration of the following conditions: Acute cholecystitis requiring IV antibiotics.   Critical care was time spent personally by me on the following activities:  Blood draw for specimens, development of treatment plan with patient or surrogate, discussions with consultants, evaluation of patient's response to treatment, examination of patient, obtaining history from patient or surrogate, review of old charts, re-evaluation of patient's condition, pulse oximetry, ordering and review of radiographic studies, ordering and review of laboratory studies and ordering and performing treatments and interventions   I assumed direction of critical care for this patient from another provider in my specialty: no     Care discussed with: admitting provider        Medications Ordered in ED Medications  buprenorphine-naloxone (SUBOXONE) 8-2 mg per SL tablet 2 tablet (2 tablets Sublingual Given 11/18/22 1755)  potassium chloride 10 mEq in 100 mL IVPB (10 mEq Intravenous New Bag/Given 11/18/22 1839)  piperacillin-tazobactam (ZOSYN) IVPB 3.375 g (has no administration in time range)    Followed by  piperacillin-tazobactam (ZOSYN) IVPB 3.375 g (has no administration in time range)  famotidine (PEPCID) IVPB 20 mg premix (0 mg Intravenous Stopped 11/18/22 1832)  ondansetron (ZOFRAN) injection 4 mg (4 mg Intravenous Given 11/18/22 1756)  potassium chloride SA (KLOR-CON M) CR tablet 20 mEq (20 mEq Oral Given 11/18/22 1757)  ketorolac (TORADOL) 30 MG/ML injection 30 mg (30 mg Intravenous Given 11/18/22 1835)    ED Course/ Medical Decision Making/ A&P Clinical Course as of 11/18/22 1902  Wynelle Link Nov 18, 2022  1720 This is a 36 year old male presented to ED with  concern for [MT]    Clinical Course User Index [MT] Trifan, Kermit Balo, MD                                 Medical Decision Making Amount and/or Complexity of Data Reviewed Labs: ordered. Radiology: ordered.  Risk Prescription drug management.   Arrie Senate 37 y.o. presented today for heroin withdrawal, abdominal pain. Working DDx that I considered at this time includes, but not limited to, heroin withdrawal, seizures, computer biliary pathology, pancreatitis, gastritis, acid reflux, electrolyte imbalance, sepsis.  R/o DDx: seizures, pancreatitis, gastritis, acid reflux, sepsis: These are considered less likely due to history of present illness, physical exam, labs/imaging findings  Review of prior external notes: 08/30/2022 ED  Unique Tests and My Interpretation:  CBC: Leukocytosis 16 CMP: Hypokalemia 3.0 Rapid UDS: THC positive Mag: Pending Lipase: Unremarkable UA: Unremarkable Right quadrant ultrasound: Bowel sonographic Murphy sign EKG: Sinus 79 bpm, no ST  elevations or depressions noted  Social Determinants of Health: EtOH/Substance Abuse  Discussion with Independent Historian:  Girlfriend  Discussion of Management of Tests:  Janee Morn, MD General Surgery; Everhart, MD Hospitalist  Risk: High: hospitalization or escalation of hospital-level care  Risk Stratification Score: COWS 7  Staffed with Trifan, MD  Plan: On exam patient was in no acute distress with stable vitals.  Patient did have right upper quadrant tenderness on exam without peritoneal signs.  Patient stated he last used IV heroin in his right AC 4 days ago and has gone through withdrawals in the past states this feels worse given the abdominal pain.  Patient's labs do show leukocytosis of 16 which is new and given the right upper quadrant we will get ultrasound to rule out hepatobiliary causes.  Patient potassium is low and so we will replenish and check a mag.  Will start patient on Suboxone here anticipate giving few days worth of this but recommend that he follows up with the Suboxone clinic.  Patient given Pepcid and Zofran for his nausea and right upper quadrant pain in case this is acid reflux related.  Patient's ultrasound does have positive sonographic Murphy sign indicative of cholecystitis.  Patient will be started on IV Zosyn and I consulted general surgery who states that they will come down to see the patient.  Patient will be given Toradol for his pain at this time.  Patient was updated of this plan and verbalizes understanding acceptance of it.  General surgery states that they will do the cholecystectomy tomorrow morning and that patient will need to be admitted to medicine that they can consult.  Hospitalist consulted.  I spoke to the hospitalist and patient was accepted for admission.  Patient stable for admission at this time.  This chart was dictated using voice recognition software.  Despite best efforts to proofread,  errors can occur which can change the  documentation meaning.  While patient was waiting for admission patient stated that he wanted to leave to go "lock his car" and that his girlfriend cannot like the car and that he has to do it personally.  Patient wants to leave against medical advice. Patient understands that his/her actions will lead to inadequate medical workup, and that he/she is at risk of complications of missed diagnosis, which includes morbidity and mortality.  Alternative options discussed such as letting his girlfriend like the car Opportunity to change mind given  Discussion witnessed by Arlyce Harman, RN and  Madalyn Rob, RN Patient is demonstrating good capacity to make decision. Patient understands that he/she needs to return to the ER immediately if his/her symptoms get worse.  Final Clinical Impression(s) / ED Diagnoses Final diagnoses:  Polysubstance abuse (HCC)  Heroin withdrawal (HCC)  Cholecystitis  Hypokalemia due to excessive gastrointestinal loss of potassium    Rx / DC Orders ED Discharge Orders     None         Remi Deter 11/18/22 1903    Netta Corrigan, PA-C 11/18/22 2019    Netta Corrigan, PA-C 11/18/22 2025    Terald Sleeper, MD 11/18/22 2231

## 2022-11-19 ENCOUNTER — Encounter (HOSPITAL_COMMUNITY): Payer: Self-pay | Admitting: Family Medicine

## 2022-11-19 DIAGNOSIS — F1193 Opioid use, unspecified with withdrawal: Secondary | ICD-10-CM | POA: Insufficient documentation

## 2022-11-19 DIAGNOSIS — K810 Acute cholecystitis: Secondary | ICD-10-CM

## 2022-11-19 DIAGNOSIS — R1011 Right upper quadrant pain: Secondary | ICD-10-CM | POA: Insufficient documentation

## 2022-11-19 DIAGNOSIS — K8020 Calculus of gallbladder without cholecystitis without obstruction: Secondary | ICD-10-CM | POA: Diagnosis not present

## 2022-11-19 LAB — CBC
HCT: 36.4 % — ABNORMAL LOW (ref 39.0–52.0)
Hemoglobin: 12.2 g/dL — ABNORMAL LOW (ref 13.0–17.0)
MCH: 28.3 pg (ref 26.0–34.0)
MCHC: 33.5 g/dL (ref 30.0–36.0)
MCV: 84.5 fL (ref 80.0–100.0)
Platelets: 425 10*3/uL — ABNORMAL HIGH (ref 150–400)
RBC: 4.31 MIL/uL (ref 4.22–5.81)
RDW: 13.4 % (ref 11.5–15.5)
WBC: 11.4 10*3/uL — ABNORMAL HIGH (ref 4.0–10.5)
nRBC: 0 % (ref 0.0–0.2)

## 2022-11-19 LAB — BASIC METABOLIC PANEL
Anion gap: 8 (ref 5–15)
BUN: 17 mg/dL (ref 6–20)
CO2: 26 mmol/L (ref 22–32)
Calcium: 9.2 mg/dL (ref 8.9–10.3)
Chloride: 99 mmol/L (ref 98–111)
Creatinine, Ser: 0.93 mg/dL (ref 0.61–1.24)
GFR, Estimated: >60 mL/min (ref >60)
Glucose, Bld: 93 mg/dL (ref 70–99)
Potassium: 3.8 mmol/L (ref 3.5–5.1)
Sodium: 133 mmol/L — ABNORMAL LOW (ref 135–145)

## 2022-11-19 LAB — COMPREHENSIVE METABOLIC PANEL
ALT: 27 U/L (ref 0–44)
AST: 18 U/L (ref 15–41)
Albumin: 3.1 g/dL — ABNORMAL LOW (ref 3.5–5.0)
Alkaline Phosphatase: 63 U/L (ref 38–126)
Anion gap: 13 (ref 5–15)
BUN: 16 mg/dL (ref 6–20)
CO2: 22 mmol/L (ref 22–32)
Calcium: 8.6 mg/dL — ABNORMAL LOW (ref 8.9–10.3)
Chloride: 98 mmol/L (ref 98–111)
Creatinine, Ser: 1 mg/dL (ref 0.61–1.24)
GFR, Estimated: >60 mL/min (ref >60)
Glucose, Bld: 120 mg/dL — ABNORMAL HIGH (ref 70–99)
Potassium: 3.2 mmol/L — ABNORMAL LOW (ref 3.5–5.1)
Sodium: 133 mmol/L — ABNORMAL LOW (ref 135–145)
Total Bilirubin: 0.3 mg/dL (ref <1.2)
Total Protein: 6.4 g/dL — ABNORMAL LOW (ref 6.5–8.1)

## 2022-11-19 LAB — MAGNESIUM: Magnesium: 2.2 mg/dL (ref 1.7–2.4)

## 2022-11-19 LAB — HIV ANTIBODY (ROUTINE TESTING W REFLEX): HIV Screen 4th Generation wRfx: NONREACTIVE

## 2022-11-19 MED ORDER — POLYETHYLENE GLYCOL 3350 17 G PO PACK: 17.0000 g | PACK | Freq: Every day | ORAL | Status: DC | PRN

## 2022-11-19 MED ORDER — NICOTINE 14 MG/24HR TD PT24
14.0000 mg | MEDICATED_PATCH | Freq: Every day | TRANSDERMAL | Status: DC
  Administered 2022-11-19 – 2022-11-21 (×2): 14 mg via TRANSDERMAL
  Filled 2022-11-19 (×3): qty 1

## 2022-11-19 MED ORDER — ONDANSETRON HCL 4 MG/2ML IJ SOLN
4.0000 mg | Freq: Four times a day (QID) | INTRAMUSCULAR | Status: DC | PRN
  Administered 2022-11-20: 4 mg via INTRAVENOUS

## 2022-11-19 MED ORDER — KETOROLAC TROMETHAMINE 30 MG/ML IJ SOLN
30.0000 mg | Freq: Four times a day (QID) | INTRAMUSCULAR | Status: DC | PRN
Start: 2022-11-19 — End: 2022-11-24
  Administered 2022-11-19 – 2022-11-21 (×9): 30 mg via INTRAVENOUS
  Filled 2022-11-19 (×8): qty 1

## 2022-11-19 MED ORDER — ACETAMINOPHEN 10 MG/ML IV SOLN
1000.0000 mg | Freq: Four times a day (QID) | INTRAVENOUS | Status: AC | PRN
  Filled 2022-11-19: qty 100

## 2022-11-19 MED ORDER — PIPERACILLIN-TAZOBACTAM 3.375 G IVPB 30 MIN
3.3750 g | Freq: Once | INTRAVENOUS | Status: AC
Start: 2022-11-19 — End: 2022-11-19
  Administered 2022-11-19: 3.375 g via INTRAVENOUS
  Filled 2022-11-19: qty 50

## 2022-11-19 MED ORDER — PIPERACILLIN-TAZOBACTAM 3.375 G IVPB
3.3750 g | Freq: Three times a day (TID) | INTRAVENOUS | Status: DC
  Administered 2022-11-19: 3.375 g via INTRAVENOUS
  Filled 2022-11-19: qty 50

## 2022-11-19 MED ORDER — SODIUM CHLORIDE 0.9 % IV SOLN
2.0000 g | INTRAVENOUS | Status: AC
  Administered 2022-11-19: 2 g via INTRAVENOUS
  Filled 2022-11-19: qty 20

## 2022-11-19 MED ORDER — POTASSIUM CHLORIDE CRYS ER 20 MEQ PO TBCR
40.0000 meq | EXTENDED_RELEASE_TABLET | Freq: Two times a day (BID) | ORAL | Status: AC
  Administered 2022-11-19 (×2): 40 meq via ORAL
  Filled 2022-11-19 (×2): qty 2

## 2022-11-19 MED ORDER — BUPRENORPHINE HCL-NALOXONE HCL 8-2 MG SL SUBL
2.0000 | SUBLINGUAL_TABLET | Freq: Every day | SUBLINGUAL | Status: DC
  Administered 2022-11-19: 2 via SUBLINGUAL
  Filled 2022-11-19 (×2): qty 2

## 2022-11-19 MED ORDER — ONDANSETRON HCL 4 MG PO TABS: 4.0000 mg | ORAL_TABLET | Freq: Four times a day (QID) | ORAL | Status: DC | PRN

## 2022-11-19 NOTE — ED Notes (Signed)
ED TO INPATIENT HANDOFF REPORT  ED Nurse Name and Phone #: Gearlean Alf Name/Age/Gender Brendan Sutton 37 y.o. male Room/Bed: OTFC/OTF  Code Status   Code Status: Full Code  Home/SNF/Other Home Patient oriented to: self, place, time, and situation Is this baseline? Yes   Triage Complete: Triage complete  Chief Complaint Acute cholecystitis [K81.0]  Triage Note Pt with no answer when called from waiting room   Pt returns to ED after leaving AMA. Pt states that He wanted to lock all of his belonging up in car and then come back. Pt reportedly here for gallbladder removal. No recent changes since leaving    Allergies No Known Allergies  Level of Care/Admitting Diagnosis ED Disposition     ED Disposition  Admit   Condition  --   Comment  Hospital Area: [facility] [[id number]]  Level of Care: Med-Surg [16]  May place patient in observation at [facility] or [facility] if equivalent level of care is available:: No  Covid Evaluation: Asymptomatic - no recent exposure (last 10 days) testing not required  Diagnosis: Acute cholecystitis [575.0.ICD-9-CM]  Admitting Physician: Para March [[id number]]  Attending Physician: Carney Living [phone]          B Medical/Surgery History Past Medical History:  Diagnosis Date   Blindness    right eye   H/O cervical fracture    No past surgical history on file.   A IV Location/Drains/Wounds Patient Lines/Drains/Airways Status     Active Line/Drains/Airways     Name Placement date Placement time Site Days   Peripheral IV 11/19/22 20 G Right Antecubital 11/19/22  0308  Antecubital  less than 1            Intake/Output Last 24 hours No intake or output data in the 24 hours ending 11/19/22 0529  Labs/Imaging Results for orders placed or performed during the hospital encounter of 11/18/22 (from the past 48 hour(s))  Urinalysis, Routine w reflex microscopic -Urine, Clean Catch      Status: None   Collection Time: 11/18/22  4:09 PM  Result Value Ref Range   Color, Urine YELLOW YELLOW   APPearance CLEAR CLEAR   Specific Gravity, Urine 1.025 1.005 - 1.030   pH 6.0 5.0 - 8.0   Glucose, UA NEGATIVE NEGATIVE mg/dL   Hgb urine dipstick NEGATIVE NEGATIVE   Bilirubin Urine NEGATIVE NEGATIVE   Ketones, ur NEGATIVE NEGATIVE mg/dL   Protein, ur NEGATIVE NEGATIVE mg/dL   Nitrite NEGATIVE NEGATIVE   Leukocytes,Ua NEGATIVE NEGATIVE    Comment: Performed at [facility], [address], [place], [place] [address]  Rapid urine drug screen (hospital performed)     Status: Abnormal   Collection Time: 11/18/22  4:09 PM  Result Value Ref Range   Opiates NONE DETECTED NONE DETECTED   Cocaine NONE DETECTED NONE DETECTED   Benzodiazepines NONE DETECTED NONE DETECTED   Amphetamines NONE DETECTED NONE DETECTED   Tetrahydrocannabinol POSITIVE (A) NONE DETECTED   Barbiturates NONE DETECTED NONE DETECTED    Comment: (NOTE) DRUG SCREEN FOR MEDICAL PURPOSES ONLY.  IF CONFIRMATION IS NEEDED FOR ANY PURPOSE, NOTIFY LAB WITHIN 5 DAYS.  LOWEST DETECTABLE LIMITS FOR URINE DRUG SCREEN Drug Class                     Cutoff (ng/mL) Amphetamine and metabolites    1000 Barbiturate and metabolites    200 Benzodiazepine  200 Opiates and metabolites        300 Cocaine and metabolites        300 THC                            50 Performed at [facility], [address] [place], [place] [address]   Lipase, blood     Status: None   Collection Time: 11/18/22  4:14 PM  Result Value Ref Range   Lipase 27 11 - 51 U/L    Comment: Performed at [facility] Lab, [address] [place], [place] [address]  Comprehensive metabolic panel     Status: Abnormal   Collection Time: 11/18/22  4:14 PM  Result Value Ref Range   Sodium 134 (L) 135 - 145 mmol/L   Potassium 3.0 (L) 3.5 - 5.1 mmol/L   Chloride 101 98 - 111 mmol/L   CO2 25 22 - 32 mmol/L   Glucose, Bld 127 (H)  70 - 99 mg/dL    Comment: Glucose reference range applies only to samples taken after fasting for at least 8 hours.   BUN 15 6 - 20 mg/dL   Creatinine, Ser 0.98 0.61 - 1.24 mg/dL   Calcium 9.0 8.9 - 11.9 mg/dL   Total Protein 6.8 6.5 - 8.1 g/dL   Albumin 3.3 (L) 3.5 - 5.0 g/dL   AST 16 15 - 41 U/L   ALT 30 0 - 44 U/L   Alkaline Phosphatase 65 38 - 126 U/L   Total Bilirubin 0.3 <1.2 mg/dL   GFR, Estimated >14 >78 mL/min    Comment: (NOTE) Calculated using the CKD-EPI Creatinine Equation (2021)    Anion gap 8 5 - 15    Comment: Performed at [facility], [address], [place], [place] [address]  CBC     Status: Abnormal   Collection Time: 11/18/22  4:14 PM  Result Value Ref Range   WBC 16.0 (H) 4.0 - 10.5 K/uL   RBC 4.80 4.22 - 5.81 MIL/uL   Hemoglobin 13.3 13.0 - 17.0 g/dL   HCT 13.0 86.5 - 78.4 %   MCV 85.2 80.0 - 100.0 fL   MCH 27.7 26.0 - 34.0 pg   MCHC 32.5 30.0 - 36.0 g/dL   RDW 69.6 29.5 - 28.4 %   Platelets 472 (H) 150 - 400 K/uL   nRBC 0.0 0.0 - 0.2 %    Comment: Performed at [facility], [address] [place], [place] [address]   US Abdomen Limited RUQ (LIVER/GB)  Result Date: 11/18/2022 CLINICAL DATA:  Right upper quadrant pain EXAM: ULTRASOUND ABDOMEN LIMITED RIGHT UPPER QUADRANT COMPARISON:  None Available. FINDINGS: Gallbladder: Large stone in the gallbladder lumen. No gallbladder wall thickening or pericholecystic fluid. Sonographer reports a positive sonographic Murphy's sign. Common bile duct: Diameter: 4 mm Liver: Increased echogenicity. No focal lesion. Portal vein is patent on color Doppler imaging with normal direction of blood flow towards the liver. Other: None. IMPRESSION: 1. Cholelithiasis without secondary signs of acute cholecystitis. Sonographer reports a positive sonographic Murphy's sign. If there is persistent clinical concern for acute cholecystitis, recommend further evaluation with HIDA scan. 2. Increased hepatic parenchymal  echogenicity suggestive of steatosis. Electronically Signed   By: Annia Belt M.D.   On: 11/18/2022 18:15    Pending Labs Unresulted Labs (From admission, onward)     Start     Ordered   11/19/22 0500  HIV Antibody (routine testing w rflx)  (HIV Antibody (Routine testing w reflex) panel)  Once,   R        11/19/22 0232   11/19/22 0500  Basic metabolic panel  Once,   R        11/19/22 0500   11/19/22 0500  Magnesium  Once,   R        11/19/22 0500   11/19/22 0136  HCV Ab w Reflex to Quant PCR  Once,   R        11/19/22 0136            Vitals/Pain Today's Vitals   11/19/22 0306 11/19/22 0315 11/19/22 0400 11/19/22 0526  BP: 123/75 124/66 111/69 105/71  Pulse: 66 69 76 64  Resp:    16  Temp: 98.9 F (37.2 C)   98.7 F (37.1 C)  TempSrc: Oral   Oral  SpO2: 98% 96% 97% 100%  PainSc: 6    6     Isolation Precautions No active isolations  Medications Medications  ketorolac (TORADOL) 30 MG/ML injection 30 mg (30 mg Intravenous Given 11/19/22 0311)  acetaminophen (OFIRMEV) IV 1,000 mg (has no administration in time range)  polyethylene glycol (MIRALAX / GLYCOLAX) packet 17 g (has no administration in time range)  ondansetron (ZOFRAN) tablet 4 mg (has no administration in time range)    Or  ondansetron (ZOFRAN) injection 4 mg (has no administration in time range)  piperacillin-tazobactam (ZOSYN) IVPB 3.375 g (has no administration in time range)  buprenorphine-naloxone (SUBOXONE) 8-2 mg per SL tablet 2 tablet (has no administration in time range)  piperacillin-tazobactam (ZOSYN) IVPB 3.375 g (0 g Intravenous Stopped 11/19/22 0439)    Mobility walks     Focused Assessments Pt with hx of IV drug use here for acute cholecystitis.  While with me he was sleeping and fortunately in no distress.  NPO   R Recommendations: See Admitting Provider Note  Report given to:   Additional Notes:  COWS

## 2022-11-19 NOTE — H&P (Addendum)
Hospital Admission History and Physical Service Pager: [phone]  Patient name: Brendan Sutton Medical record number: [id number] Date of Birth: 1985/09/07 Age: 37 y.o. Gender: male  Primary Care Provider: Patient, No Pcp Per Consultants: general surgery  Code Status: FULL   Preferred Emergency Contact:  Contact Information     Name Relation Home Work Mobile   [place] Mother [phone]  [phone]      Other Contacts   None on File      Chief Complaint: heroin withdrawal, abdominal pain   Assessment and Plan: Brendan Sutton is a 37 y.o. 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 General surgery consulted by ED provider and plans for cholecystectomy. Of note, left AMA and presented back to ED. Most likely acute cholecystitis given + murphy's sign and cholelithiasis on RUQ ultrasound. Differentials include opiate withdrawal, pancreatitis although less likely given normal lipase, pt also with history of Hepatitis C (HCV Ab obtained), gastroenteritis.   Assessment & Plan Cholecystitis Right upper quadrant ultrasound with positive sonographic Murphy sign and cholelithiasis.  General surgery consulted by ED provider and plan to perform cholecystectomy.  Started on IV Zosyn in the ED and given Toradol for pain, along with Pepcid and Zofran for nausea. -Admitted to FMTS, Dr. Deirdre Priest attending -General Surgery on board, cholecystectomy tentatively planned for 11/11 per EDP -N.p.o. -Tylenol 1000 mg IV every 6 hours as needed -Toradol 30 mg every 6 hours IV as needed -Zofran 4 mg every 6 hours as needed -Zosyn x 1, ask general surgery about need for continued abx -Monitor fever curve (if elevated consider blood cultures and MRSA coverage d/t hx of MRSA endocarditis/ophthalmic involvement) -SCDs pending surgery -AM  BMP Heroin withdrawal (HCC) Last used 4 days ago.  Has been intermittently taking his girlfriends Subutex over the last 3 days, and took an 8 mg Suboxone pill yesterday.  S/p Suboxone 8-2mg  2 tablet in the ED. History of Hep C on previous admission. Unclear if treated. -suboxone 8-2mg  2 tablet daily (titrate with symptoms) -COWS q4h -analgesic and antiemetic as above -TOC consult for substance use counseling -HCV Ab -HIV Hypokalemia due to excessive gastrointestinal loss of potassium K 3.0 in the ED, s/p Kcl -am BMP, Mag  FEN/GI: NPO VTE Prophylaxis: SCDs  Disposition: observation status, med-surg  History of Present Illness:  Brendan Sutton is a 37 y.o. male presenting with abdominal pain and withdrawal symptoms.  States that he used heroin 4 days ago.  Began developing withdrawal symptoms the next day, fever, chills, nausea, vomiting, abdominal pain.  States the abdominal pain was worse than his typical withdrawals.  Reports he used his girlfriends Subutex strips starting 3 days ago (16-20 mg films), and then took one 8 mg Suboxone pill yesterday.  States last bowel movement 4 days ago but also reports diarrhea.  Was able to eat food earlier today.  Denies chest pain, shortness of breath.   In the ED, pt had RUQ ultrasound with cholelithiasis and positive sonographic Murphy's sign, leukocytosis at 16. Hypokalemic at 3.0. ED Provider consulted General Surgery who plan for cholecystectomy but would like pt admitted to our service due to opiate withdrawal.   Patient was admitted last night, left AMA, and then returned. Stated he and his girlfriend "needed to lock up is car" as this is where they live. Patient did let me know  he would not attempt to leave again.   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   Pertinent Past Surgical History: Surgery to R eye   Remainder reviewed in history tab.   Pertinent Social History: Tobacco use: Yes 0.5 PPD 20 years Alcohol use: No use Other Substance use: Heroin, marijuana Lives with wife   Pertinent Family History: None  Remainder reviewed in history tab.   Important Outpatient Medications: None Remainder reviewed in medication history.   Objective: BP 104/78 (BP Location: Right Arm)   Pulse (!) 105   Temp 98.6 F (37 C) (Oral)   Resp 16   SpO2 98%  Exam: General: No acute distress, alert, responsive to questions Eyes: Blind in right eye, left eye with pupil that is equal and reactive Cardiovascular: Regular rate and rhythm, no murmurs Respiratory: Clear to auscultation bilaterally, no increased work of breathing on room air Gastrointestinal: Normal bowel sounds.  Diffuse tenderness to palpation over abdomen.  Worse in right upper quadrant (+murphy's).  No distention. MSK: Track marks noted over right hand, unchanged from prior.  No swelling bilateral lower extremities. Neuro: Alert and oriented x 4 Psych: Normal affect  Labs:  CBC BMET  Recent Labs  Lab 11/18/22 1614  WBC 16.0*  HGB 13.3  HCT 40.9  PLT 472*   Recent Labs  Lab 11/18/22 1614  NA 134*  K 3.0*  CL 101  CO2 25  BUN 15  CREATININE 0.92  GLUCOSE 127*  CALCIUM 9.0    U/A unremarkable UDS positive THC Lipase 27  EKG: NSR at 79BPM. Qtc . No ST elevation or T wave inversions.     Imaging Studies Performed: [place] RUQ 11/18/22 IMPRESSION: 1. Cholelithiasis without secondary signs of acute cholecystitis. Sonographer reports a positive sonographic Murphy's sign. If there is persistent clinical concern for acute cholecystitis, recommend further evaluation with HIDA scan. 2. Increased hepatic parenchymal echogenicity suggestive of steatosis.   I independently reviewed and agree with radiologist's impression of imaging  study.  Everhart, Kirstie, DO 11/19/2022, 2:04 AM PGY-1, [facility]  FPTS Intern pager: [phone], text pages welcome Secure chat group [facility] Teaching Service   Upper Level Addendum:  I have seen and evaluated this patient along with Dr. Rexene Alberts and reviewed the above note, making necessary revisions as appropriate.  I agree with the medical decision making and physical exam as noted above.  Levin Erp, MD PGY-3 [facility] Family Medicine Residency

## 2022-11-19 NOTE — Assessment & Plan Note (Addendum)
Right upper quadrant ultrasound with positive sonographic Murphy sign and cholelithiasis.  General surgery consulted. S/p 3.375 Zosyn in the ED. Initial WBC of 16.0. Remains afebrile.  - Gen surg to see patient today. NPO at [facility] for possible procedure tomorrow. - Plan to adjust suboxone as needed iso possible procedure  - Tylenol 1000 q6 prn for mild or moderate pain - Toradol 30 q6 prn for sever pain   - Zofran 4 prn  - Miralax prn  - AM CBC, CMP - Consider CT Abdomen for worsening symptoms

## 2022-11-19 NOTE — Consult Note (Signed)
Brendan Sutton [birth year]  [id number].    Requesting MD: Dr. 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  He states he was in heroin withdrawal at the same time and so initially wasn't sure if they were all related.  He has had some nausea and diarrhea.  No vomiting.  He states eating seems to actually make his pain better at times.  He admits to occasional reflux, but he denies any previous history of ulcer disease.  He presented to the ED yesterday with persistent abdominal pain.  He was noted to have a WBC of 16K and an [place] with a large stone in the gallbladder lumen but no definitive evidence of cholecystitis at that time, but with a + sonographic Murphy's sign.  He left AMA for some reason last night, but returned today.  We have been asked to see him for further recommendations.  ROS: ROS: see HPI  History reviewed. No pertinent family history.  Past Medical History:  Diagnosis Date   Blindness    right eye   H/O cervical fracture     Past Surgical History:  Procedure Laterality Date   EYE SURGERY      Social History:  reports that he has been smoking cigarettes. He has never used smokeless tobacco. He reports that he does not currently use alcohol. He reports current drug use. Drugs: Marijuana and Heroin.  Allergies: No Known Allergies  No medications prior to admission.     Physical Exam: Blood pressure 101/62, pulse 61, temperature 98.7 F (37.1 C), temperature source Oral, resp. rate 16, SpO2 93%. General: pleasant, WD, WN white male who is laying in bed in NAD HEENT: head is normocephalic, atraumatic.  Sclera are noninjected.  PERRL.  Ears and nose without any masses or lesions.  Mouth is pink and moist Heart: regular, rate, and rhythm.  Normal s1,s2. No obvious  murmurs, gallops, or rubs noted.  Palpable radial and pedal pulses bilaterally Lungs: CTAB, no wheezes, rhonchi, or rales noted.  Respiratory effort nonlabored Abd: soft, tender in RUQ and RMQ, ND, +BS, no masses, hernias, or organomegaly Skin: warm and dry with no masses, lesions, or rashes, diffuse tattoos  Psych: A&Ox3 with an appropriate affect.   Results for orders placed or performed during the hospital encounter of 11/18/22 (from the past 48 hour(s))  Basic metabolic panel     Status: Abnormal   Collection Time: 11/19/22  3:09 AM  Result Value Ref Range   Sodium 133 (L) 135 - 145 mmol/L   Potassium 3.8 3.5 - 5.1 mmol/L   Chloride 99 98 - 111 mmol/L   CO2 26 22 - 32 mmol/L   Glucose, Bld 93 70 - 99 mg/dL    Comment: Glucose reference range applies only to samples taken after fasting for at least 8 hours.   BUN 17 6 - 20 mg/dL   Creatinine, Ser 4.69 0.61 - 1.24 mg/dL   Calcium 9.2 8.9 - 62.9 mg/dL   GFR, Estimated >52 >84 mL/min    Comment: (NOTE) Calculated using the CKD-EPI Creatinine Equation (2021)    Anion gap 8 5 - 15    Comment: Performed at [facility], [address], [place], [place] [address]  Magnesium     Status: None   Collection Time: 11/19/22  3:09 AM  Result Value Ref Range   Magnesium 2.2 1.7 - 2.4 mg/dL    Comment: Performed at [facility], [address] [place], [place] [address]  HIV Antibody (routine testing w rflx)     Status: None   Collection Time: 11/19/22  3:10 AM  Result Value Ref Range   HIV Screen 4th Generation wRfx Non Reactive Non Reactive    Comment: Performed at [facility], [address], [place], [place] [address]  CBC     Status: Abnormal   Collection Time: 11/19/22 12:15 PM  Result Value Ref Range   WBC 11.4 (H) 4.0 - 10.5 K/uL   RBC 4.31 4.22 - 5.81 MIL/uL   Hemoglobin 12.2 (L) 13.0 - 17.0 g/dL   HCT 53.6 (L) 64.4 - 03.4 %   MCV 84.5 80.0 - 100.0 fL   MCH 28.3 26.0 - 34.0 pg   MCHC 33.5 30.0 - 36.0 g/dL    RDW 74.2 59.5 - 63.8 %   Platelets 425 (H) 150 - 400 K/uL   nRBC 0.0 0.0 - 0.2 %    Comment: Performed at [facility], [address], [place], [place] [address]   US Abdomen Limited RUQ (LIVER/GB)  Result Date: 11/18/2022 CLINICAL DATA:  Right upper quadrant pain EXAM: ULTRASOUND ABDOMEN LIMITED RIGHT UPPER QUADRANT COMPARISON:  None Available. FINDINGS: Gallbladder: Large stone in the gallbladder lumen. No gallbladder wall thickening or pericholecystic fluid. Sonographer reports a positive sonographic Murphy's sign. Common bile duct: Diameter: 4 mm Liver: Increased echogenicity. No focal lesion. Portal vein is patent on color Doppler imaging with normal direction of blood flow towards the liver. Other: None. IMPRESSION: 1. Cholelithiasis without secondary signs of acute cholecystitis. Sonographer reports a positive sonographic Murphy's sign. If there is persistent clinical concern for acute cholecystitis, recommend further evaluation with HIDA scan. 2. Increased hepatic parenchymal echogenicity suggestive of steatosis. Electronically Signed   By: Annia Belt M.D.   On: 11/18/2022 18:15      Assessment/Plan Cholelithiasis, possible early cholecystitis The patient has been seen, examined, labs, vitals, imaging, and chart personally reviewed.  He appears to have a large gallstone in his gallbladder that is likely contributing to some degree of obstruction causing his symptoms.  He has had clear liquids today currently so we will continue to allow this today and plan for NPO p MN tonight for OR tomorrow.  He is agreeable.     FEN - CLD, NPO p MN VTE - ok for chemical prophylaxis from our standpoint ID - Rocephin  Blindness Tobacco abuse - nicotine patch ordered at this request Recent heroin use - states he has been clean for 6 days. Hypokalemia - will give Kdur today and recheck BMET in am to assure K corrected prior to OR  I reviewed ED provider notes, hospitalist notes, last 24 h  vitals and pain scores, last 48 h intake and output, last 24 h labs and trends, and last 24 h imaging results.  Letha Cape, [facility] Surgery 11/19/2022, 1:15 PM Please see Amion for pager number during day hours 7:00am-4:30pm or 7:00am -11:30am on weekends

## 2022-11-19 NOTE — Progress Notes (Signed)
This nurse believes pt is no longer experiencing heroin withdrawals anymore because he's currently under the influence. Upon entering pt's room during rounds, pt could barely stay awake to answer questions. However, pt was able to tell me that he was in pain (RUQ abdomen), score 6 and requested toradol. I believe that the girlfriend is leaving and coming back bringing drugs in. The girlfriend was very somnolent and not once opened her eyes to acknowledge me. This nurse went in the second time with Erin Sons, RN and Rosalyn Gess, NT for reference. While vitals were being taken pt was trying to keep eyes open. The girlfriend was he sitting on the bedside eyes closed, trying not to fall over. Syringes without the needle were found in the trash can, removed upon leaving. Jonny Ruiz, [facility] notified per above. John advised if syringes are found again to call security, security will check belongings, and girlfriend would have to leave. Dr. Rexene Alberts and Dr. Laroy Apple notified per above. Doctors came to speak with pt.

## 2022-11-19 NOTE — Assessment & Plan Note (Signed)
Last used 4 days ago.  Has been intermittently taking his girlfriends Subutex over the last 3 days, and took an 8 mg Suboxone pill yesterday.  S/p Suboxone 8-2mg  2 tablet in the ED. History of Hep C on previous admission. Unclear if treated. COWS of 0 overnight. HIV negative.  - Suboxone 8-2mg  2 tablet daily (titrate with symptoms) - COWS q4h - Aanalgesic and antiemetic as above - TOC consulted for substance use counseling - HCV Ab - collected and pending

## 2022-11-19 NOTE — Plan of Care (Signed)
FMTS Brief Progress Note  S: Patient seen at bedside with Dr. Rexene Alberts after message from nursing staff about concerns for drug use in hospital.  Nursing staff let us know via secure chat:  He could barely stay awake to answer questions. Believes that the girlfriend is leaving possibly bringing drugs in. Girlfriend was very somnolent and trying not to fall over the side of the bed. Noted on day shift as well.  We went to bedside and stated that he would like to speak with patient alone.  Patient preferred to have girlfriend at bedside with him for conversation.  I discussed the concerns that were noted during the daytime shift.  Patient denies any drug use in the hospital.  I discussed with him the risks of doing medications like heroin in the hospital including risk of death and respiratory depression during surgery especially.  Patient was able to communicate this with me.  I also discussed that there is room to go up on Suboxone and if patient is having withdrawal symptoms.  Patient states he has some mild pain but otherwise doing okay.  Messaged by nursing again at around 9:40 NT and one other nurse found syringes in the trash can. AC was notified and advised if syringes are found again to call security, security will check belongings, and girlfriend would have to leave.   Appreciate nursing's care for patient.  If this is recurrent then will enforce no guest policy. Patient aware of risks and able to communicate this with [place].  Levin Erp, MD 11/19/2022, 10:04 PM PGY-3, [facility] Night Resident  Please page [phone] with questions.

## 2022-11-19 NOTE — ED Provider Notes (Signed)
[place] EMERGENCY DEPARTMENT AT [facility] Provider Note   CSN: [id number] Arrival date & time: 11/18/22  2043     History  Chief Complaint  Patient presents with   Abdominal Pain    MCLAREN LOUCH is a 37 y.o. male.  Seen here earlier. Admit orders had been placed. Patient states he had to 'go lock his car'. No changes since he locked his car. Plan was for admit and EGS consult/ surgery in AM.    Abdominal Pain      Home Medications Prior to Admission medications   Not on File      Allergies    Patient has no known allergies.    Review of Systems   Review of Systems  Gastrointestinal:  Positive for abdominal pain.    Physical Exam Updated Vital Signs BP 104/78 (BP Location: Right Arm)   Pulse (!) 105   Temp 98.7 F (37.1 C)   Resp 16   SpO2 98%  Physical Exam Vitals and nursing note reviewed.  Constitutional:      Appearance: He is well-developed.  HENT:     Head: Normocephalic and atraumatic.  Cardiovascular:     Rate and Rhythm: Normal rate.  Pulmonary:     Effort: Pulmonary effort is normal. No respiratory distress.  Abdominal:     General: There is no distension.  Musculoskeletal:        General: Normal range of motion.     Cervical back: Normal range of motion.  Neurological:     Mental Status: He is alert.     ED Results / Procedures / Treatments   Labs (all labs ordered are listed, but only abnormal results are displayed) Labs Reviewed  HCV AB W REFLEX TO QUANT PCR  MAGNESIUM  BASIC METABOLIC PANEL  HIV ANTIBODY (ROUTINE TESTING W REFLEX)    EKG None  Radiology US Abdomen Limited RUQ (LIVER/GB)  Result Date: 11/18/2022 CLINICAL DATA:  Right upper quadrant pain EXAM: ULTRASOUND ABDOMEN LIMITED RIGHT UPPER QUADRANT COMPARISON:  None Available. FINDINGS: Gallbladder: Large stone in the gallbladder lumen. No gallbladder wall thickening or pericholecystic fluid. Sonographer reports a positive sonographic  Murphy's sign. Common bile duct: Diameter: 4 mm Liver: Increased echogenicity. No focal lesion. Portal vein is patent on color Doppler imaging with normal direction of blood flow towards the liver. Other: None. IMPRESSION: 1. Cholelithiasis without secondary signs of acute cholecystitis. Sonographer reports a positive sonographic Murphy's sign. If there is persistent clinical concern for acute cholecystitis, recommend further evaluation with HIDA scan. 2. Increased hepatic parenchymal echogenicity suggestive of steatosis. Electronically Signed   By: Annia Belt M.D.   On: 11/18/2022 18:15    Procedures Procedures    Medications Ordered in ED Medications  ketorolac (TORADOL) 30 MG/ML injection 30 mg (has no administration in time range)  acetaminophen (OFIRMEV) IV 1,000 mg (has no administration in time range)  polyethylene glycol (MIRALAX / GLYCOLAX) packet 17 g (has no administration in time range)  ondansetron (ZOFRAN) tablet 4 mg (has no administration in time range)    Or  ondansetron (ZOFRAN) injection 4 mg (has no administration in time range)  piperacillin-tazobactam (ZOSYN) IVPB 3.375 g (has no administration in time range)  piperacillin-tazobactam (ZOSYN) IVPB 3.375 g (has no administration in time range)  buprenorphine-naloxone (SUBOXONE) 8-2 mg per SL tablet 2 tablet (has no administration in time range)    ED Course/ Medical Decision Making/ A&P  Medical Decision Making Risk Decision regarding hospitalization.   Appears well enough, no changes.  No indication for repeat workup as workup had been done in the last few hours. Famil Med to admit.   Final Clinical Impression(s) / ED Diagnoses Final diagnoses:  Cholecystitis  Heroin withdrawal (HCC)  Hypokalemia due to excessive gastrointestinal loss of potassium    Rx / DC Orders ED Discharge Orders     None         Paulanthony Gleaves, Barbara Cower, MD 11/19/22 [phone]

## 2022-11-19 NOTE — ED Notes (Signed)
Pt had been sleeping since I gave him antibiotics.  No distress at this time.  Visitor also sleeping on stretcher with pt.

## 2022-11-19 NOTE — Assessment & Plan Note (Signed)
Last used 4 days ago.  Has been intermittently taking his girlfriends Subutex over the last 3 days, and took an 8 mg Suboxone pill yesterday.  S/p Suboxone 8-2mg  2 tablet in the ED. History of Hep C on previous admission. Unclear if treated. -suboxone 8-2mg  2 tablet daily (titrate with symptoms) -COWS q4h -analgesic and antiemetic as above -TOC consult for substance use counseling -HCV Ab -HIV

## 2022-11-19 NOTE — H&P (View-Only) (Signed)
Brendan Sutton [birth year]  [id number].    Requesting MD: Dr. 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  He states he was in heroin withdrawal at the same time and so initially wasn't sure if they were all related.  He has had some nausea and diarrhea.  No vomiting.  He states eating seems to actually make his pain better at times.  He admits to occasional reflux, but he denies any previous history of ulcer disease.  He presented to the ED yesterday with persistent abdominal pain.  He was noted to have a WBC of 16K and an [place] with a large stone in the gallbladder lumen but no definitive evidence of cholecystitis at that time, but with a + sonographic Murphy's sign.  He left AMA for some reason last night, but returned today.  We have been asked to see him for further recommendations.  ROS: ROS: see HPI  History reviewed. No pertinent family history.  Past Medical History:  Diagnosis Date   Blindness    right eye   H/O cervical fracture     Past Surgical History:  Procedure Laterality Date   EYE SURGERY      Social History:  reports that he has been smoking cigarettes. He has never used smokeless tobacco. He reports that he does not currently use alcohol. He reports current drug use. Drugs: Marijuana and Heroin.  Allergies: No Known Allergies  No medications prior to admission.     Physical Exam: Blood pressure 101/62, pulse 61, temperature 98.7 F (37.1 C), temperature source Oral, resp. rate 16, SpO2 93%. General: pleasant, WD, WN white male who is laying in bed in NAD HEENT: head is normocephalic, atraumatic.  Sclera are noninjected.  PERRL.  Ears and nose without any masses or lesions.  Mouth is pink and moist Heart: regular, rate, and rhythm.  Normal s1,s2. No obvious  murmurs, gallops, or rubs noted.  Palpable radial and pedal pulses bilaterally Lungs: CTAB, no wheezes, rhonchi, or rales noted.  Respiratory effort nonlabored Abd: soft, tender in RUQ and RMQ, ND, +BS, no masses, hernias, or organomegaly Skin: warm and dry with no masses, lesions, or rashes, diffuse tattoos  Psych: A&Ox3 with an appropriate affect.   Results for orders placed or performed during the hospital encounter of 11/18/22 (from the past 48 hour(s))  Basic metabolic panel     Status: Abnormal   Collection Time: 11/19/22  3:09 AM  Result Value Ref Range   Sodium 133 (L) 135 - 145 mmol/L   Potassium 3.8 3.5 - 5.1 mmol/L   Chloride 99 98 - 111 mmol/L   CO2 26 22 - 32 mmol/L   Glucose, Bld 93 70 - 99 mg/dL    Comment: Glucose reference range applies only to samples taken after fasting for at least 8 hours.   BUN 17 6 - 20 mg/dL   Creatinine, Ser 4.69 0.61 - 1.24 mg/dL   Calcium 9.2 8.9 - 62.9 mg/dL   GFR, Estimated >52 >84 mL/min    Comment: (NOTE) Calculated using the CKD-EPI Creatinine Equation (2021)    Anion gap 8 5 - 15    Comment: Performed at [facility], [address], [place], [place] [address]  Magnesium     Status: None   Collection Time: 11/19/22  3:09 AM  Result Value Ref Range   Magnesium 2.2 1.7 - 2.4 mg/dL    Comment: Performed at [facility], [address] [place], [place] [address]  HIV Antibody (routine testing w rflx)     Status: None   Collection Time: 11/19/22  3:10 AM  Result Value Ref Range   HIV Screen 4th Generation wRfx Non Reactive Non Reactive    Comment: Performed at [facility], [address], [place], [place] [address]  CBC     Status: Abnormal   Collection Time: 11/19/22 12:15 PM  Result Value Ref Range   WBC 11.4 (H) 4.0 - 10.5 K/uL   RBC 4.31 4.22 - 5.81 MIL/uL   Hemoglobin 12.2 (L) 13.0 - 17.0 g/dL   HCT 53.6 (L) 64.4 - 03.4 %   MCV 84.5 80.0 - 100.0 fL   MCH 28.3 26.0 - 34.0 pg   MCHC 33.5 30.0 - 36.0 g/dL    RDW 74.2 59.5 - 63.8 %   Platelets 425 (H) 150 - 400 K/uL   nRBC 0.0 0.0 - 0.2 %    Comment: Performed at [facility], [address], [place], [place] [address]   US Abdomen Limited RUQ (LIVER/GB)  Result Date: 11/18/2022 CLINICAL DATA:  Right upper quadrant pain EXAM: ULTRASOUND ABDOMEN LIMITED RIGHT UPPER QUADRANT COMPARISON:  None Available. FINDINGS: Gallbladder: Large stone in the gallbladder lumen. No gallbladder wall thickening or pericholecystic fluid. Sonographer reports a positive sonographic Murphy's sign. Common bile duct: Diameter: 4 mm Liver: Increased echogenicity. No focal lesion. Portal vein is patent on color Doppler imaging with normal direction of blood flow towards the liver. Other: None. IMPRESSION: 1. Cholelithiasis without secondary signs of acute cholecystitis. Sonographer reports a positive sonographic Murphy's sign. If there is persistent clinical concern for acute cholecystitis, recommend further evaluation with HIDA scan. 2. Increased hepatic parenchymal echogenicity suggestive of steatosis. Electronically Signed   By: Annia Belt M.D.   On: 11/18/2022 18:15      Assessment/Plan Cholelithiasis, possible early cholecystitis The patient has been seen, examined, labs, vitals, imaging, and chart personally reviewed.  He appears to have a large gallstone in his gallbladder that is likely contributing to some degree of obstruction causing his symptoms.  He has had clear liquids today currently so we will continue to allow this today and plan for NPO p MN tonight for OR tomorrow.  He is agreeable.     FEN - CLD, NPO p MN VTE - ok for chemical prophylaxis from our standpoint ID - Rocephin  Blindness Tobacco abuse - nicotine patch ordered at this request Recent heroin use - states he has been clean for 6 days. Hypokalemia - will give Kdur today and recheck BMET in am to assure K corrected prior to OR  I reviewed ED provider notes, hospitalist notes, last 24 h  vitals and pain scores, last 48 h intake and output, last 24 h labs and trends, and last 24 h imaging results.  Letha Cape, [facility] Surgery 11/19/2022, 1:15 PM Please see Amion for pager number during day hours 7:00am-4:30pm or 7:00am -11:30am on weekends

## 2022-11-19 NOTE — Progress Notes (Addendum)
Daily Progress Note Intern Pager: [phone]  Patient name: Brendan Sutton Medical record number: [id number] Date of birth: 28-Dec-1985 Age: 37 y.o. Gender: male  Primary Care Provider: Patient, No Pcp Per Consultants: Gen Surg Code Status: Full   Pt Overview and Major Events to Date:  11/10: Admitted, briefly left AMA 11/11: Admitted again   Assessment and Plan:  [name] is a 37 y.o. 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 Most likely acute cholecystitis given + murphy's sign and cholelithiasis on RUQ ultrasound. Differentials include opiate withdrawal, pancreatitis although less likely given normal lipase, Hepatitis C, and gastroenteritis.  Assessment & Plan RUQ pain Right upper quadrant ultrasound with positive sonographic Murphy sign and cholelithiasis.  General surgery consulted. S/p 3.375 Zosyn in the ED. Initial WBC of 16.0. Remains afebrile.  - Gen surg to see patient today. NPO at [facility] for possible procedure tomorrow. - Plan to adjust suboxone as needed iso possible procedure  - Tylenol 1000 q6 prn for mild or moderate pain - Toradol 30 q6 prn for sever pain   - Zofran 4 prn  - Miralax prn  - AM CBC, CMP - Consider CT Abdomen for worsening symptoms   Heroin withdrawal (HCC) Last used 4 days ago.  Has been intermittently taking his girlfriends Subutex over the last 3 days, and took an 8 mg Suboxone pill yesterday.  S/p Suboxone 8-2mg  2 tablet in the ED. History of Hep C on previous admission. Unclear if treated. COWS of 0 overnight. HIV negative.  - Suboxone 8-2mg  2 tablet daily (titrate with symptoms) - COWS q4h - Aanalgesic and antiemetic as above - TOC consulted for substance use counseling - HCV Ab - collected and pending   FEN/GI: Clear liquids, NPO at MN PPx: SCDs Dispo: Home pending clinical  improvement   Subjective:  Patient feeling a little better this morning. Continued R sided abdominal pain. No N/V/D. No BM since admission. No CP/SOB/HA.   Objective: Temp:  [98.4 F (36.9 C)-98.9 F (37.2 C)] 98.9 F (37.2 C) (11/11 0540) Pulse Rate:  [57-105] 57 (11/11 0540) Resp:  [16-18] 18 (11/11 0540) BP: (104-129)/(55-78) 112/69 (11/11 0540) SpO2:  [96 %-100 %] 100 % (11/11 0540) Weight:  [70.8 kg] 70.8 kg (11/10 1605) Physical Exam: General: Sleeping, easily awoken. No acute distress. Cardiovascular: Normal S1/S2. No extra heart sounds. Warm and well-perfused.  Respiratory: Breathing comfortably on RA. CTAB. No increased WOB. Abdomen: BS present. Tender to palpation of RUQ. No rebound or guarding.  Extremities: No LE edema.   Laboratory: Most recent CBC Lab Results  Component Value Date   WBC 16.0 (H) 11/18/2022   HGB 13.3 11/18/2022   HCT 40.9 11/18/2022   MCV 85.2 11/18/2022   PLT 472 (H) 11/18/2022   Most recent BMP    Latest Ref Rng & Units 11/19/2022    3:09 AM  BMP  Glucose 70 - 99 mg/dL 93   BUN 6 - 20 mg/dL 17   Creatinine 4.01 - 1.24 mg/dL 0.27   Sodium 253 - 664 mmol/L 133   Potassium 3.5 - 5.1 mmol/L 3.8   Chloride 98 - 111 mmol/L 99   CO2 22 - 32 mmol/L 26   Calcium 8.9 - 10.3 mg/dL 9.2    HIV: negative  Ivery Quale, MD 11/19/2022, 7:17 AM  PGY-1, [facility] FPTS  Intern pager: [phone], text pages welcome Secure chat group [facility] Teaching Service

## 2022-11-19 NOTE — TOC Initial Note (Signed)
Transition of Care [facility]) - Initial/Assessment Note    Patient Details  Name: Brendan Sutton MRN: [id number] Date of Birth: 02-02-1985  [facility] CM/SW Contact:    Harriet Masson, RN Phone Number: 11/19/2022, 1:13 PM  Clinical Narrative:                 Spoke to patient regarding transition needs. Patient states he is homeless but can walk to [employer] and wellness for follow up apt for PCP. This RNCM sent request to CMA to make PCP apt. Substance abuse resources given to patient.  Patient will need bus pass at discharge.  TOC will continue to follow for needs.  Expected Discharge Plan: Home/Self Care Barriers to Discharge: Continued Medical Work up   Patient Goals and CMS Choice Patient states their goals for this hospitalization and ongoing recovery are:: return to the streets-homeless          Expected Discharge Plan and Services In-house Referral: PCP / Health Connect     Living arrangements for the past 2 months: Homeless                                      Prior Living Arrangements/Services Living arrangements for the past 2 months: Homeless   Patient language and need for interpreter reviewed:: Yes              Criminal Activity/Legal Involvement Pertinent to Current Situation/Hospitalization: No - Comment as needed  Activities of Daily Living      Permission Sought/Granted                  Emotional Assessment Appearance:: Appears stated age Attitude/Demeanor/Rapport: Gracious Affect (typically observed): Accepting Orientation: : Oriented to Situation, Oriented to  Time, Oriented to Place, Oriented to Self Alcohol / Substance Use: Illicit Drugs Psych Involvement: No (comment)  Admission diagnosis:  Acute cholecystitis [K81.0] Cholecystitis [K81.9] Heroin withdrawal (HCC) [F11.93] Hypokalemia due to excessive gastrointestinal loss of potassium [E87.6] Patient Active Problem List   Diagnosis Date  Noted   RUQ pain 11/19/2022   Heroin withdrawal (HCC) 11/19/2022   Acute cholecystitis 11/18/2022   PCP:  Patient, No Pcp Per Pharmacy:   [id number] - [place], [place] - [address] AT [facility] [address] [place] [address] Phone: [phone] Fax: [phone]     Social Determinants of Health (SDOH) Social History: SDOH Screenings   Tobacco Use: High Risk (11/18/2022)   SDOH Interventions:     Readmission Risk Interventions     No data to display

## 2022-11-20 ENCOUNTER — Other Ambulatory Visit: Payer: Self-pay

## 2022-11-20 ENCOUNTER — Observation Stay (HOSPITAL_COMMUNITY): Payer: 59

## 2022-11-20 ENCOUNTER — Encounter (HOSPITAL_COMMUNITY)
Admission: EM | Disposition: A | Discharge status: Home / Self Care | Payer: Self-pay | Source: Home / Self Care | Attending: Emergency Medicine

## 2022-11-20 ENCOUNTER — Observation Stay (HOSPITAL_BASED_OUTPATIENT_CLINIC_OR_DEPARTMENT_OTHER): Payer: 59

## 2022-11-20 ENCOUNTER — Encounter (HOSPITAL_COMMUNITY): Payer: Self-pay | Admitting: Family Medicine

## 2022-11-20 DIAGNOSIS — K811 Chronic cholecystitis: Secondary | ICD-10-CM | POA: Diagnosis not present

## 2022-11-20 DIAGNOSIS — K819 Cholecystitis, unspecified: Secondary | ICD-10-CM | POA: Diagnosis not present

## 2022-11-20 DIAGNOSIS — K8000 Calculus of gallbladder with acute cholecystitis without obstruction: Secondary | ICD-10-CM | POA: Diagnosis not present

## 2022-11-20 DIAGNOSIS — K8010 Calculus of gallbladder with chronic cholecystitis without obstruction: Secondary | ICD-10-CM | POA: Diagnosis not present

## 2022-11-20 DIAGNOSIS — K810 Acute cholecystitis: Secondary | ICD-10-CM | POA: Diagnosis not present

## 2022-11-20 HISTORY — PX: CHOLECYSTECTOMY: SHX55

## 2022-11-20 LAB — BASIC METABOLIC PANEL
Anion gap: 9 (ref 5–15)
BUN: 10 mg/dL (ref 6–20)
CO2: 23 mmol/L (ref 22–32)
Calcium: 8.5 mg/dL — ABNORMAL LOW (ref 8.9–10.3)
Chloride: 104 mmol/L (ref 98–111)
Creatinine, Ser: 0.77 mg/dL (ref 0.61–1.24)
GFR, Estimated: >60 mL/min (ref >60)
Glucose, Bld: 106 mg/dL — ABNORMAL HIGH (ref 70–99)
Potassium: 4.3 mmol/L (ref 3.5–5.1)
Sodium: 136 mmol/L (ref 135–145)

## 2022-11-20 LAB — CBC
HCT: 36.6 % — ABNORMAL LOW (ref 39.0–52.0)
Hemoglobin: 11.9 g/dL — ABNORMAL LOW (ref 13.0–17.0)
MCH: 27.7 pg (ref 26.0–34.0)
MCHC: 32.5 g/dL (ref 30.0–36.0)
MCV: 85.1 fL (ref 80.0–100.0)
Platelets: 444 10*3/uL — ABNORMAL HIGH (ref 150–400)
RBC: 4.3 MIL/uL (ref 4.22–5.81)
RDW: 13.4 % (ref 11.5–15.5)
WBC: 14.7 10*3/uL — ABNORMAL HIGH (ref 4.0–10.5)
nRBC: 0 % (ref 0.0–0.2)

## 2022-11-20 SURGERY — LAPAROSCOPIC CHOLECYSTECTOMY
Anesthesia: General | Site: Abdomen

## 2022-11-20 MED ORDER — FENTANYL CITRATE (PF) 100 MCG/2ML IJ SOLN: 25.0000 ug | INTRAMUSCULAR | Status: DC | PRN

## 2022-11-20 MED ORDER — DEXMEDETOMIDINE HCL IN NACL 80 MCG/20ML IV SOLN
INTRAVENOUS | Status: AC
  Filled 2022-11-20: qty 20

## 2022-11-20 MED ORDER — LIDOCAINE 2% (20 MG/ML) 5 ML SYRINGE
INTRAMUSCULAR | Status: AC
  Filled 2022-11-20: qty 5

## 2022-11-20 MED ORDER — ACETAMINOPHEN 10 MG/ML IV SOLN
1000.0000 mg | Freq: Four times a day (QID) | INTRAVENOUS | Status: DC | PRN
  Administered 2022-11-20: 1000 mg via INTRAVENOUS
  Filled 2022-11-20: qty 100

## 2022-11-20 MED ORDER — ACETAMINOPHEN 500 MG PO TABS: 1000.0000 mg | ORAL_TABLET | Freq: Once | ORAL | Status: DC | PRN

## 2022-11-20 MED ORDER — BUPIVACAINE-EPINEPHRINE 0.25% -1:200000 IJ SOLN
INTRAMUSCULAR | Status: DC | PRN
  Administered 2022-11-20: 8 mL

## 2022-11-20 MED ORDER — PROPOFOL 10 MG/ML IV BOLUS
INTRAVENOUS | Status: AC
Start: 2022-11-20 — End: ?
  Filled 2022-11-20: qty 20

## 2022-11-20 MED ORDER — HYDROMORPHONE HCL 1 MG/ML IJ SOLN
INTRAMUSCULAR | Status: AC
  Filled 2022-11-20: qty 1

## 2022-11-20 MED ORDER — CHLORHEXIDINE GLUCONATE 0.12 % MT SOLN
OROMUCOSAL | Status: AC
  Administered 2022-11-20: 15 mL via OROMUCOSAL
  Filled 2022-11-20: qty 15

## 2022-11-20 MED ORDER — KETOROLAC TROMETHAMINE 30 MG/ML IJ SOLN
INTRAMUSCULAR | Status: AC
  Filled 2022-11-20: qty 1

## 2022-11-20 MED ORDER — FENTANYL CITRATE (PF) 250 MCG/5ML IJ SOLN
INTRAMUSCULAR | Status: DC | PRN
  Administered 2022-11-20: 125 ug via INTRAVENOUS
  Administered 2022-11-20: 25 ug via INTRAVENOUS

## 2022-11-20 MED ORDER — OXYCODONE HCL 5 MG PO TABS
10.0000 mg | ORAL_TABLET | ORAL | Status: DC | PRN
  Administered 2022-11-20 – 2022-11-21 (×3): 10 mg via ORAL
  Filled 2022-11-20 (×3): qty 2

## 2022-11-20 MED ORDER — ROCURONIUM BROMIDE 10 MG/ML (PF) SYRINGE
PREFILLED_SYRINGE | INTRAVENOUS | Status: DC | PRN
  Administered 2022-11-20: 60 mg via INTRAVENOUS

## 2022-11-20 MED ORDER — HYDROMORPHONE HCL 1 MG/ML IJ SOLN
0.2500 mg | INTRAMUSCULAR | Status: DC | PRN
Start: 2022-11-20 — End: 2022-11-20
  Administered 2022-11-20 (×3): 0.5 mg via INTRAVENOUS

## 2022-11-20 MED ORDER — MELATONIN 5 MG PO TABS: 5.0000 mg | ORAL_TABLET | Freq: Every evening | ORAL | Status: DC | PRN

## 2022-11-20 MED ORDER — MIDAZOLAM HCL 2 MG/2ML IJ SOLN
INTRAMUSCULAR | Status: AC
  Filled 2022-11-20: qty 2

## 2022-11-20 MED ORDER — ACETAMINOPHEN 10 MG/ML IV SOLN
INTRAVENOUS | Status: DC | PRN
  Administered 2022-11-20: 1000 mg via INTRAVENOUS

## 2022-11-20 MED ORDER — FENTANYL CITRATE (PF) 250 MCG/5ML IJ SOLN
INTRAMUSCULAR | Status: AC
  Filled 2022-11-20: qty 5

## 2022-11-20 MED ORDER — NALOXONE HCL 0.4 MG/ML IJ SOLN: 0.4000 mg | INTRAMUSCULAR | Status: DC | PRN

## 2022-11-20 MED ORDER — SODIUM CHLORIDE 0.9 % IR SOLN
Status: DC | PRN
  Administered 2022-11-20: 1000 mL

## 2022-11-20 MED ORDER — DEXAMETHASONE SODIUM PHOSPHATE 10 MG/ML IJ SOLN
INTRAMUSCULAR | Status: DC | PRN
  Administered 2022-11-20: 8 mg via INTRAVENOUS

## 2022-11-20 MED ORDER — HYDROMORPHONE HCL 2 MG PO TABS
1.0000 mg | ORAL_TABLET | Freq: Once | ORAL | Status: AC
  Administered 2022-11-20: 1 mg via ORAL
  Filled 2022-11-20: qty 1

## 2022-11-20 MED ORDER — ROCURONIUM BROMIDE 10 MG/ML (PF) SYRINGE
PREFILLED_SYRINGE | INTRAVENOUS | Status: AC
  Filled 2022-11-20: qty 10

## 2022-11-20 MED ORDER — ONDANSETRON HCL 4 MG/2ML IJ SOLN
INTRAMUSCULAR | Status: AC
Start: 2022-11-20 — End: ?
  Filled 2022-11-20: qty 2

## 2022-11-20 MED ORDER — MIDAZOLAM HCL 2 MG/2ML IJ SOLN
INTRAMUSCULAR | Status: DC | PRN
  Administered 2022-11-20: 2 mg via INTRAVENOUS

## 2022-11-20 MED ORDER — SUGAMMADEX SODIUM 200 MG/2ML IV SOLN
INTRAVENOUS | Status: DC | PRN
  Administered 2022-11-20: 200 mg via INTRAVENOUS

## 2022-11-20 MED ORDER — OXYCODONE HCL 5 MG PO TABS
10.0000 mg | ORAL_TABLET | Freq: Four times a day (QID) | ORAL | Status: DC | PRN
  Administered 2022-11-20: 10 mg via ORAL
  Filled 2022-11-20: qty 2

## 2022-11-20 MED ORDER — CHLORHEXIDINE GLUCONATE 0.12 % MT SOLN: 15.0000 mL | Freq: Once | OROMUCOSAL | Status: AC

## 2022-11-20 MED ORDER — BUPIVACAINE-EPINEPHRINE (PF) 0.25% -1:200000 IJ SOLN
INTRAMUSCULAR | Status: AC
  Filled 2022-11-20: qty 30

## 2022-11-20 MED ORDER — DEXMEDETOMIDINE HCL IN NACL 80 MCG/20ML IV SOLN
INTRAVENOUS | Status: DC | PRN
  Administered 2022-11-20 (×2): 8 ug via INTRAVENOUS
  Administered 2022-11-20: 4 ug via INTRAVENOUS

## 2022-11-20 MED ORDER — LIDOCAINE 2% (20 MG/ML) 5 ML SYRINGE
INTRAMUSCULAR | Status: DC | PRN
  Administered 2022-11-20: 60 mg via INTRAVENOUS

## 2022-11-20 MED ORDER — ORAL CARE MOUTH RINSE: 15.0000 mL | Freq: Once | OROMUCOSAL | Status: AC

## 2022-11-20 MED ORDER — ACETAMINOPHEN 160 MG/5ML PO SOLN: 1000.0000 mg | Freq: Once | ORAL | Status: DC | PRN

## 2022-11-20 MED ORDER — PROPOFOL 10 MG/ML IV BOLUS
INTRAVENOUS | Status: DC | PRN
  Administered 2022-11-20 (×2): 200 mg via INTRAVENOUS

## 2022-11-20 MED ORDER — ACETAMINOPHEN 10 MG/ML IV SOLN
INTRAVENOUS | Status: AC
  Filled 2022-11-20: qty 100

## 2022-11-20 MED ORDER — PHENYLEPHRINE 80 MCG/ML (10ML) SYRINGE FOR IV PUSH (FOR BLOOD PRESSURE SUPPORT)
PREFILLED_SYRINGE | INTRAVENOUS | Status: AC
  Filled 2022-11-20: qty 10

## 2022-11-20 MED ORDER — ACETAMINOPHEN 325 MG PO TABS
650.0000 mg | ORAL_TABLET | Freq: Four times a day (QID) | ORAL | Status: DC
  Administered 2022-11-20 – 2022-11-21 (×4): 650 mg via ORAL
  Filled 2022-11-20 (×5): qty 2

## 2022-11-20 MED ORDER — OXYCODONE HCL 5 MG/5ML PO SOLN: 5.0000 mg | Freq: Once | ORAL | Status: DC | PRN

## 2022-11-20 MED ORDER — PROPOFOL 10 MG/ML IV BOLUS
INTRAVENOUS | Status: AC
  Filled 2022-11-20: qty 20

## 2022-11-20 MED ORDER — LACTATED RINGERS IV SOLN: INTRAVENOUS | Status: DC | PRN

## 2022-11-20 MED ORDER — PHENYLEPHRINE 80 MCG/ML (10ML) SYRINGE FOR IV PUSH (FOR BLOOD PRESSURE SUPPORT)
PREFILLED_SYRINGE | INTRAVENOUS | Status: DC | PRN
  Administered 2022-11-20: 80 ug via INTRAVENOUS
  Administered 2022-11-20 (×2): 160 ug via INTRAVENOUS
  Administered 2022-11-20: 80 ug via INTRAVENOUS

## 2022-11-20 MED ORDER — DEXAMETHASONE SODIUM PHOSPHATE 10 MG/ML IJ SOLN
INTRAMUSCULAR | Status: AC
  Filled 2022-11-20: qty 1

## 2022-11-20 MED ORDER — KETAMINE HCL 10 MG/ML IJ SOLN
INTRAMUSCULAR | Status: DC | PRN
  Administered 2022-11-20: 40 mg via INTRAVENOUS

## 2022-11-20 MED ORDER — OXYCODONE HCL 5 MG PO TABS: 5.0000 mg | ORAL_TABLET | Freq: Once | ORAL | Status: DC | PRN

## 2022-11-20 MED ORDER — ACETAMINOPHEN 10 MG/ML IV SOLN
1000.0000 mg | Freq: Once | INTRAVENOUS | Status: DC | PRN
Start: 2022-11-20 — End: 2022-11-20

## 2022-11-20 MED ORDER — KETAMINE HCL 50 MG/5ML IJ SOSY
PREFILLED_SYRINGE | INTRAMUSCULAR | Status: AC
  Filled 2022-11-20: qty 5

## 2022-11-20 SURGICAL SUPPLY — 41 items
ADH SKN CLS APL DERMABOND .7 (GAUZE/BANDAGES/DRESSINGS) ×1
APL PRP STRL LF DISP 70% ISPRP (MISCELLANEOUS) ×1
APPLIER CLIP ROT 10 11.4 M/L (STAPLE) ×1
APR CLP MED LRG 11.4X10 (STAPLE) ×1
BAG COUNTER SPONGE SURGICOUNT (BAG) ×2 IMPLANT
BAG SPEC RTRVL 10 TROC 200 (ENDOMECHANICALS) ×1
BAG SPNG CNTER NS LX DISP (BAG) ×1
BLADE CLIPPER SURG (BLADE) IMPLANT
CANISTER SUCT 3000ML PPV (MISCELLANEOUS) ×2 IMPLANT
CHLORAPREP W/TINT 26 (MISCELLANEOUS) ×2 IMPLANT
CLIP APPLIE ROT 10 11.4 M/L (STAPLE) ×2 IMPLANT
COVER SURGICAL LIGHT HANDLE (MISCELLANEOUS) ×2 IMPLANT
DERMABOND ADVANCED .7 DNX12 (GAUZE/BANDAGES/DRESSINGS) ×2 IMPLANT
ELECT REM PT RETURN 9FT ADLT (ELECTROSURGICAL) ×1
ELECTRODE REM PT RTRN 9FT ADLT (ELECTROSURGICAL) ×2 IMPLANT
GLOVE BIO SURGEON STRL SZ8 (GLOVE) ×2 IMPLANT
GLOVE BIOGEL PI IND STRL 8 (GLOVE) ×2 IMPLANT
GOWN STRL REUS W/ TWL LRG LVL3 (GOWN DISPOSABLE) ×4 IMPLANT
GOWN STRL REUS W/ TWL XL LVL3 (GOWN DISPOSABLE) ×2 IMPLANT
GOWN STRL REUS W/TWL LRG LVL3 (GOWN DISPOSABLE) ×2
GOWN STRL REUS W/TWL XL LVL3 (GOWN DISPOSABLE) ×1
IRRIG SUCT STRYKERFLOW 2 WTIP (MISCELLANEOUS) ×1
IRRIGATION SUCT STRKRFLW 2 WTP (MISCELLANEOUS) ×2 IMPLANT
KIT BASIN OR (CUSTOM PROCEDURE TRAY) ×2 IMPLANT
KIT TURNOVER KIT B (KITS) ×2 IMPLANT
NS IRRIG 1000ML POUR BTL (IV SOLUTION) ×2 IMPLANT
PAD ARMBOARD 7.5X6 YLW CONV (MISCELLANEOUS) ×2 IMPLANT
POUCH RETRIEVAL ECOSAC 10 (ENDOMECHANICALS) ×2 IMPLANT
SCISSORS LAP 5X35 DISP (ENDOMECHANICALS) ×2 IMPLANT
SET TUBE SMOKE EVAC HIGH FLOW (TUBING) ×2 IMPLANT
SLEEVE Z-THREAD 5X100MM (TROCAR) ×2 IMPLANT
SPECIMEN JAR SMALL (MISCELLANEOUS) ×2 IMPLANT
SUT MNCRL AB 4-0 PS2 18 (SUTURE) ×2 IMPLANT
TOWEL GREEN STERILE (TOWEL DISPOSABLE) ×2 IMPLANT
TOWEL GREEN STERILE FF (TOWEL DISPOSABLE) ×2 IMPLANT
TRAY LAPAROSCOPIC MC (CUSTOM PROCEDURE TRAY) ×2 IMPLANT
TROCAR 11X100 Z THREAD (TROCAR) ×2 IMPLANT
TROCAR BALLN 12MMX100 BLUNT (TROCAR) ×2 IMPLANT
TROCAR Z-THREAD OPTICAL 5X100M (TROCAR) ×2 IMPLANT
WARMER LAPAROSCOPE (MISCELLANEOUS) ×2 IMPLANT
WATER STERILE IRR 1000ML POUR (IV SOLUTION) ×2 IMPLANT

## 2022-11-20 NOTE — Progress Notes (Incomplete)
Pt wanted me to give him his PRN oxy to "get some sleep." after I had given him the toradol. I did tell pt that I didn't feel comfortable giving him the oxy but I could get the Dr to put in something to help him sleep. He said "never mind I'll just wait until the other nurse come in tomorrow."

## 2022-11-20 NOTE — Anesthesia Procedure Notes (Signed)
Procedure Name: Intubation Date/Time: 11/20/2022 8:18 AM  Performed by: Waynard Edwards, CRNAPre-anesthesia Checklist: Patient identified, Emergency Drugs available, Suction available and Patient being monitored Patient Re-evaluated:Patient Re-evaluated prior to induction Oxygen Delivery Method: Circle System Utilized Preoxygenation: Pre-oxygenation with 100% oxygen Induction Type: IV induction Ventilation: Mask ventilation without difficulty Laryngoscope Size: Mac and 4 Grade View: Grade I Tube type: Oral Tube size: 7.5 mm Number of attempts: 1 Airway Equipment and Method: Stylet Placement Confirmation: ETT inserted through vocal cords under direct vision, positive ETCO2 and breath sounds checked- equal and bilateral Secured at: 23 cm Tube secured with: Tape Dental Injury: Teeth and Oropharynx as per pre-operative assessment  Comments: Lauren Cozart, SRNA placed ETT under direct supervision. CRNA and MDA at bedside.

## 2022-11-20 NOTE — Anesthesia Preprocedure Evaluation (Signed)
Anesthesia Evaluation  Patient identified by MRN, date of birth, ID band Patient awake    Reviewed: Allergy & Precautions, NPO status , Patient's Chart, lab work & pertinent test results  History of Anesthesia Complications Negative for: history of anesthetic complications  Airway Mallampati: II  TM Distance: >3 FB Neck ROM: Full    Dental  (+) Teeth Intact, Dental Advisory Given, Chipped,    Pulmonary neg shortness of breath, neg sleep apnea, neg COPD, neg recent URI, Current Smoker and Patient abstained from smoking.   breath sounds clear to auscultation       Cardiovascular negative cardio ROS  Rhythm:Regular     Neuro/Psych negative neurological ROS  negative psych ROS   GI/Hepatic negative GI ROS,,,(+)     substance abuse  IV drug useCholecystitis  Lab Results      Component                Value               Date                      ALT                      27                  11/19/2022                AST                      18                  11/19/2022                ALKPHOS                  63                  11/19/2022                BILITOT                  0.3                 11/19/2022              Endo/Other  negative endocrine ROS    Renal/GU negative Renal ROS     Musculoskeletal negative musculoskeletal ROS (+)    Abdominal   Peds  Hematology  (+) Blood dyscrasia, anemia Lab Results      Component                Value               Date                      WBC                      11.4 (H)            11/19/2022                HGB                      12.2 (L)            11/19/2022  HCT                      36.4 (L)            11/19/2022                MCV                      84.5                11/19/2022                PLT                      425 (H)             11/19/2022              Anesthesia Other Findings   Reproductive/Obstetrics                               Anesthesia Physical Anesthesia Plan  ASA: 2  Anesthesia Plan: General   Post-op Pain Management: Ketamine IV*, Precedex and Ofirmev IV (intra-op)*   Induction:   PONV Risk Score and Plan: 2 and Ondansetron and Dexamethasone  Airway Management Planned: Oral ETT  Additional Equipment: None  Intra-op Plan:   Post-operative Plan: Extubation in OR  Informed Consent: I have reviewed the patients History and Physical, chart, labs and discussed the procedure including the risks, benefits and alternatives for the proposed anesthesia with the patient or authorized representative who has indicated his/her understanding and acceptance.     Dental advisory given  Plan Discussed with: CRNA  Anesthesia Plan Comments:          Anesthesia Quick Evaluation

## 2022-11-20 NOTE — Hospital Course (Signed)
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 His hospital course is detailed below:  Heroin withdrawal Last used 4 days prior to hospital admission, intermittently used girlfriend's subutex and suboxone in that time. Pt was treated for withdrawal with Suboxone 8-2mg , titrated based on symptoms ***. TOC provided substance abuse resources.    Abdominal pain RUQ [place] with positive sonographic murphy sign and cholelithiasis. Pt had laparoscopic cholecystectomy on 11/12 with no complications. ***   Other chronic conditions were medically managed with home medications and formulary alternatives as necessary   PCP Follow-up Recommendations: Substance use counseling

## 2022-11-20 NOTE — Op Note (Signed)
Laparoscopic Cholecystectomy  Procedure Note  Indications: This patient presents with symptomatic gallbladder disease and will undergo laparoscopic cholecystectomy.The procedure has been discussed with the patient. Operative and non operative treatments have been discussed. Risks of surgery include bleeding, infection,  Common bile duct injury,  Injury to the stomach,liver, colon,small intestine, abdominal wall,  Diaphragm,  Major blood vessels,  And the need for an open procedure.  Other risks include worsening of medical problems, death,  DVT and pulmonary embolism, and cardiovascular events.   Medical options have also been discussed. The patient has been informed of long term expectations of surgery and non surgical options,  The patient agrees to proceed.     Pre-operative Diagnosis: Calculus of gallbladder with acute cholecystitis, without mention of obstruction  Post-operative Diagnosis: Same  Surgeon: Dortha Schwalbe  MD   Assistants: Berenda Morale RNFA   Anesthesia: General endotracheal anesthesia and Local anesthesia 0.25.% bupivacaine  ASA Class: 2  Procedure Details  The patient was seen again in the Holding Room. The risks, benefits, complications, treatment options, and expected outcomes were discussed with the patient. The possibilities of reaction to medication, pulmonary aspiration, perforation of viscus, bleeding, recurrent infection, finding a normal gallbladder, the need for additional procedures, failure to diagnose a condition, the possible need to convert to an open procedure, and creating a complication requiring transfusion or operation were discussed with the patient. The patient and/or family concurred with the proposed plan, giving informed consent. The site of surgery properly noted/marked. The patient was taken to Operating Room, identified as Brendan Sutton and the procedure verified as Laparoscopic Cholecystectomy with Intraoperative Cholangiograms. A Time  Out was held and the above information confirmed.  Prior to the induction of general anesthesia, antibiotic prophylaxis was administered. General endotracheal anesthesia was then administered and tolerated well. After the induction, the abdomen was prepped in the usual sterile fashion. The patient was positioned in the supine position with the left arm comfortably tucked, along with some reverse Trendelenburg.  Local anesthetic agent was injected into the skin near the umbilicus and an incision made. The midline fascia was incised and the Hasson technique was used to introduce a 12 mm port under direct vision. It was secured with a figure of eight Vicryl suture placed in the usual fashion. Pneumoperitoneum was then created with CO2 and tolerated well without any adverse changes in the patient's vital signs. Additional trocars were introduced under direct vision with an 11 mm trocar in the epigastrium and 2 5 mm trocars in the right upper quadrant. All skin incisions were infiltrated with a local anesthetic agent before making the incision and placing the trocars.   The gallbladder was identified, the fundus grasped and retracted cephalad. Adhesions were lysed bluntly and with the electrocautery where indicated, taking care not to injure any adjacent organs or viscus. The infundibulum was grasped and retracted laterally, exposing the peritoneum overlying the triangle of Calot. This was then divided and exposed in a blunt fashion. The cystic duct was clearly identified and bluntly dissected circumferentially. The junctions of the gallbladder, cystic duct and common bile duct were clearly identified prior to the division of any linear structure.   The pt had an extremely small cystic duct that would not accommodate a catheter. Cholangiogram was not performed.  The critical view was obtained.    The cystic duct was then  ligated with surgical clips  on the patient side and  clipped on the gallbladder side  and divided. The cystic artery  was identified, dissected free, ligated with clips and divided as well. Posterior cystic artery clipped and divided.  The gallbladder was dissected from the liver bed in retrograde fashion with the electrocautery. The gallbladder was removed with Ecopouch. The liver bed was irrigated and inspected. Hemostasis was achieved with the electrocautery. Copious irrigation was utilized and was repeatedly aspirated until clear all particulate matter. Hemostasis was achieved with no signs  Of bleeding or bile leakage.  Pneumoperitoneum was completely reduced after viewing removal of the trocars under direct vision. The wound was thoroughly irrigated and the fascia was then closed with a figure of eight suture; the skin was then closed with 40 monocryl  and a sterile dressing of dermabond  was applied.  Instrument, sponge, and needle counts were correct at closure and at the conclusion of the case.   Findings: Cholecystitis with Cholelithiasis  Estimated Blood Loss: Minimal         Drains: none         Total IV Fluids: per OR record          Specimens: Gallbladder           Complications: None; patient tolerated the procedure well.         Disposition: PACU - hemodynamically stable.         Condition: stable

## 2022-11-20 NOTE — Discharge Instructions (Addendum)
Dear Brendan Sutton,   Thank you for letting us participate in your care! 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 You had your gallbladder removed.    POST-HOSPITAL & CARE INSTRUCTIONS We recommend following up with your PCP within 1 week from being discharged from the hospital. Please also follow up your surgeon as below.   DOCTOR'S APPOINTMENTS & FOLLOW UP Future Appointments  Date Time Provider Department Center  12/10/2022  3:10 PM Grayce Sessions, NP [facility] None     Thank you for choosing [facility]! Take care and be well!  Family Medicine Teaching Service Inpatient Team [place]  [facility]  [address] [place], [place] [address] [phone]    [facility]  Please arrive at least 30 min before your appointment to complete your check in paperwork.  If you are unable to arrive 30 min prior to your appointment time we may have to cancel or reschedule you. LAPAROSCOPIC SURGERY: POST OP INSTRUCTIONS Always review your discharge instruction sheet given to you by the facility where your surgery was performed. IF YOU HAVE DISABILITY OR FAMILY LEAVE FORMS, YOU MUST BRING THEM TO THE OFFICE FOR PROCESSING.   DO NOT GIVE THEM TO YOUR DOCTOR.  PAIN CONTROL  First take acetaminophen (Tylenol) AND/or ibuprofen (Advil) to control your pain after surgery.  Follow directions on package.  Taking acetaminophen (Tylenol) and/or ibuprofen (Advil) regularly after surgery will help to control your pain and lower the amount of prescription pain medication you may need.  You should not take more than 4,000 mg (4 grams) of acetaminophen (Tylenol) in 24 hours.  You should not take ibuprofen (Advil), aleve, motrin, naprosyn or other NSAIDS if you have a history of stomach ulcers or chronic kidney disease.  A prescription for pain medication may be given to you upon discharge.  Take your pain  medication as prescribed, if you still have uncontrolled pain after taking acetaminophen (Tylenol) or ibuprofen (Advil). Use ice packs to help control pain. If you need a refill on your pain medication, please contact your pharmacy.  They will contact our office to request authorization. Prescriptions will not be filled after 5pm or on week-ends.  HOME MEDICATIONS Take your usually prescribed medications unless otherwise directed.  DIET You should follow a light diet the first few days after arrival home.  Be sure to include lots of fluids daily. Avoid fatty, fried foods.   CONSTIPATION It is common to experience some constipation after surgery and if you are taking pain medication.  Increasing fluid intake and taking a stool softener (such as Colace) will usually help or prevent this problem from occurring.  A mild laxative (Milk of Magnesia or Miralax) should be taken according to package instructions if there are no bowel movements after 48 hours.  WOUND/INCISION CARE Most patients will experience some swelling and bruising in the area of the incisions.  Ice packs will help.  Swelling and bruising can take several days to resolve.  Unless discharge instructions indicate otherwise, follow guidelines below  STERI-STRIPS - you may remove your outer bandages 48 hours after surgery, and you may shower at that time.  You have steri-strips (small skin tapes) in place directly over the incision.  These strips should be left on the skin for 7-10 days.   DERMABOND/SKIN GLUE - you may shower in 24 hours.  The glue will flake off over the next 2-3 weeks. Any  sutures or staples will be removed at the office during your follow-up visit.  ACTIVITIES You may resume regular (light) daily activities beginning the next day--such as daily self-care, walking, climbing stairs--gradually increasing activities as tolerated.  You may have sexual intercourse when it is comfortable.  Refrain from any heavy lifting or  straining until approved by your doctor. You may drive when you are no longer taking prescription pain medication, you can comfortably wear a seatbelt, and you can safely maneuver your car and apply brakes.  FOLLOW-UP You should see your doctor in the office for a follow-up appointment approximately 2-3 weeks after your surgery.  You should have been given your post-op/follow-up appointment when your surgery was scheduled.  If you did not receive a post-op/follow-up appointment, make sure that you call for this appointment within a day or two after you arrive home to insure a convenient appointment time.   WHEN TO CALL YOUR DOCTOR: Fever over 101.0 Inability to urinate Continued bleeding from incision. Increased pain, redness, or drainage from the incision. Increasing abdominal pain  The clinic staff is available to answer your questions during regular business hours.  Please don't hesitate to call and ask to speak to one of the nurses for clinical concerns.  If you have a medical emergency, go to the nearest emergency room or call 911.  A surgeon from [facility] is always on call at the hospital. [address], [address], [place], [place]  [address] ? [address] [address], [place], [place]   [address] [phone] ? [phone] ? FAX [phone]     Managing Your Pain After Surgery Without Opioids    Thank you for participating in our program to help patients manage their pain after surgery without opioids. This is part of our effort to provide you with the best care possible, without exposing you or your family to the risk that opioids pose.  What pain can I expect after surgery? You can expect to have some pain after surgery. This is normal. The pain is typically worse the day after surgery, and quickly begins to get better. Many studies have found that many patients are able to manage their pain after surgery with Over-the-Counter (OTC) medications such as Tylenol and  Motrin. If you have a condition that does not allow you to take Tylenol or Motrin, notify your surgical team.  How will I manage my pain? The best strategy for controlling your pain after surgery is around the clock pain control with Tylenol (acetaminophen) and Motrin (ibuprofen or Advil). Alternating these medications with each other allows you to maximize your pain control. In addition to Tylenol and Motrin, you can use heating pads or ice packs on your incisions to help reduce your pain.  How will I alternate your regular strength over-the-counter pain medication? You will take a dose of pain medication every three hours. Start by taking 650 mg of Tylenol (2 pills of 325 mg) 3 hours later take 600 mg of Motrin (3 pills of 200 mg) 3 hours after taking the Motrin take 650 mg of Tylenol 3 hours after that take 600 mg of Motrin.   - 1 -  See example - if your first dose of Tylenol is at 12:00 PM   12:00 PM Tylenol 650 mg (2 pills of 325 mg)  3:00 PM Motrin 600 mg (3 pills of 200 mg)  6:00 PM Tylenol 650 mg (2 pills of 325 mg)  9:00 PM Motrin 600 mg (3 pills  of 200 mg)  Continue alternating every 3 hours   We recommend that you follow this schedule around-the-clock for at least 3 days after surgery, or until you feel that it is no longer needed. Use the table on the last page of this handout to keep track of the medications you are taking. Important: Do not take more than 3000mg  of Tylenol or 3200mg  of Motrin in a 24-hour period. Do not take ibuprofen/Motrin if you have a history of bleeding stomach ulcers, severe kidney disease, &/or actively taking a blood thinner  What if I still have pain? If you have pain that is not controlled with the over-the-counter pain medications (Tylenol and Motrin or Advil) you might have what we call "breakthrough" pain. You will receive a prescription for a small amount of an opioid pain medication such as Oxycodone, Tramadol, or Tylenol with Codeine. Use  these opioid pills in the first 24 hours after surgery if you have breakthrough pain. Do not take more than 1 pill every 4-6 hours.  If you still have uncontrolled pain after using all opioid pills, don't hesitate to call our staff using the number provided. We will help make sure you are managing your pain in the best way possible, and if necessary, we can provide a prescription for additional pain medication.   Day 1    Time  Name of Medication Number of pills taken  Amount of Acetaminophen  Pain Level   Comments  AM PM       AM PM       AM PM       AM PM       AM PM       AM PM       AM PM       AM PM       Total Daily amount of Acetaminophen Do not take more than  3,000 mg per day      Day 2    Time  Name of Medication Number of pills taken  Amount of Acetaminophen  Pain Level   Comments  AM PM       AM PM       AM PM       AM PM       AM PM       AM PM       AM PM       AM PM       Total Daily amount of Acetaminophen Do not take more than  3,000 mg per day      Day 3    Time  Name of Medication Number of pills taken  Amount of Acetaminophen  Pain Level   Comments  AM PM       AM PM       AM PM       AM PM         AM PM       AM PM       AM PM       AM PM       Total Daily amount of Acetaminophen Do not take more than  3,000 mg per day      Day 4    Time  Name of Medication Number of pills taken  Amount of Acetaminophen  Pain Level   Comments  AM PM       AM PM       AM PM  AM PM       AM PM       AM PM       AM PM       AM PM       Total Daily amount of Acetaminophen Do not take more than  3,000 mg per day      Day 5    Time  Name of Medication Number of pills taken  Amount of Acetaminophen  Pain Level   Comments  AM PM       AM PM       AM PM       AM PM       AM PM       AM PM       AM PM       AM PM       Total Daily amount of Acetaminophen Do not take more than  3,000 mg per day      Day 6     Time  Name of Medication Number of pills taken  Amount of Acetaminophen  Pain Level  Comments  AM PM       AM PM       AM PM       AM PM       AM PM       AM PM       AM PM       AM PM       Total Daily amount of Acetaminophen Do not take more than  3,000 mg per day      Day 7    Time  Name of Medication Number of pills taken  Amount of Acetaminophen  Pain Level   Comments  AM PM       AM PM       AM PM       AM PM       AM PM       AM PM       AM PM       AM PM       Total Daily amount of Acetaminophen Do not take more than  3,000 mg per day        For additional information about how and where to safely dispose of unused opioid medications - [email or web address]  Disclaimer: This document contains information and/or instructional materials adapted from [facility] for the typical patient with your condition. It does not replace medical advice from your health care provider because your experience may differ from that of the typical patient. Talk to your health care provider if you have any questions about this document, your condition or your treatment plan. Adapted from [facility] assistance programs. If you are behind on your bills and expenses, and need some help to make it through a short term hardship or financial emergency, there are several organizations and charities in the [place] area that may be able to help. They range from the [employer], [employer], Landscape architect of [employer] and the local community action agency, the [employer], [employer]. These groups may be able to provide you resources to help pay your utility bills, rent, and they even offer housing assistance.  Crisis assistance program Find help for paying your rent, electric bills, free food, and even funds to pay your mortgage. The [employer] 773-134-0026) offers several  services to local families, as  funding allows. The Emergency Assistance Program (EAP), which they administer, provides household goods, free food, clothing, and financial aid to people in need in the [facility] area. The EAP program does have some qualification, and counselors will interview clients for financial assistance by written referral only. Referrals need to be made by the Department of Social Services or by other EAP approved human services agencies or charities in the area.  Money for resources for emergency assistance are available for security deposits for rent, water, electric, and gas, past due rent, utility bills, past due mortgage payments, food, and clothing. The [employer] also operates a Programme researcher, broadcasting/film/video on the site. More [employer].  Open [employer] of [employer], which can be reached at [phone], offers emergency assistance programs for those in need of help, such as food, rent assistance, a soup kitchen, shelter, and clothing. They are based in [facility] but provide a number of services to those that qualify for assistance. Continue with Open [employer]ries programs.  [facility] Department of Social Services may be able to offer temporary financial assistance and cash grants for paying rent and utilities. Help may be provided for local county residents who may be experiencing personal crisis when other resources, including government programs, are not available. Call [phone]  [employer] [place] [employer], which is based in [place], provides financial assistance of up to $50.00 to help pay for rent, utilities, cooling bills, rent, and prescription medications. The program also provides secondhand furniture to those in need. [phone]  [employer] is a Geneticist, molecular. The organization can offer emergency assistance for paying rent, electric bills, utilities, food,  household products and furniture. They offer extensive emergency and transitional housing for families, children and single women, and also run a Boy's and Dole Food. [address] [address], [employer], and other aid offered too. [address], [place], [place] [address], [phone]  Additional locations of the [employer] are in [place] and other nearby communities. When you have an emergency, need free food, money for basic needs, or just need assistance around Christmas, then the [employer] may have the resources you need. Or they can refer you to nearby agencies. Learn more.  [place] Low Income Risk manager - This is offered for [facility] families. The federal government created [employer] Program provides a one-time cash grant payment to help eligible low-income families pay their electric and heating bills. [address], [place], [place] [address], [phone]  Government and [employer] - The county administers several emergency and self-sufficiency programs. Residents of [place] can get help with energy bills and food, rent, and other expenses. In addition, work with a Sports coach who may be able to help you find a job or improve your employment skills. More [place] public assistance.  [place] Emergency Assistance - A program offers emergency utility and rent funds for greater [employer] area residents. The program can also provide counseling and referrals to charities and government programs. Also provides food and a free meal program that serves lunch Mondays - Saturdays and dinner seven days per week to individuals in the community. [address] [place], [place] [address], [phone]  [employer] - Offers affordable apartment and housing communities across [place] and [place]. The low income and seniors can access public housing, rental assistance to  qualified applicants,  and apply for the section 8 rent subsidy program. Other programs include Research officer, trade union. [address] [place], [place] [address], dial [phone].  Basic needs such as clothing - Low income families can receive free items (school supplies, clothes, holiday assistance, etc.) from clothing closets while more moderate income [address] families can shop at [employer]. Locations across the area help the needy. Get information on [place] free clothing centers.  The [facility] provides transitional housing to veterans and the disabled. Clients will also access other services too, including life skills classes, case management, and assistance in finding permanent housing. [address], [place], [place] [address], call [phone]  [place] Transitional Housing in [place] is for people who were just evicted or that are formerly homeless. The non-profit will also help then gain self-sufficiency, find a home or apartment to live in, and also provides information on rent assistance when needed. Dial [phone]  [employer] Partnership to End Homelessness is available in [place]. Families that were evicted or that are homeless can gain shelter, food, clothing, furniture, and also emergency financial assistance. Other services include financial skills and life skills coaching, job training, and case management. [address], [place], [place] [address]. Telephone [phone].  The [employer], [employer]. runs the [employer]. This can help people save money on their heating and summer cooling bills, and is free to low income families. Free upgrades can be made to your home. Phone [phone]  Many of the non-profits and programs mentioned above are all inclusive, meaning they can meet many needs of the low income, such as energy  bills, food, rent, and more. However there are several organizations that focus just on rent and housing. Read more on rent assistance in [place] region.  Legal assistance for evictions, foreclosure, and more If you need free legal advice on civili issues, such as foreclosures, evictions, Electronics engineer, government programs, domestic issues and more, Armed forces operational officer Aid of [facility]) is a Associate Professor firm that provides free legal services and counsel to lower income people, seniors, disabled, and others. The goal is to ensure everyone has access to justice and fair representation.  Call them at [phone], or click here to learn more about [place] free legal assistance programs.  [place] [employer] and funds for emergency expenses The [employer] is another organization that can provide people with [employer] and funds to pay bills. Their assistance depends on funding, and the demand for help is always very high. They can provide cash to help pay rent, a missed mortgage payment, or gas, electric, and water bills. But the assistance doesn't stop there. They also have a food pantry on site, which can provide food once every three (3) months to people who need help. The [employer] can also offer a Engineering geologist once every three (3) months for a maximum three (3) times. After receiving this voucher over that period of time, applicants can receive this aid one every six (6) months after that. [phone].  Kohl's action agency The [employer], [employer]. offers job and Dispensing optician. Resources are focused on helping students obtain the skills and experiences that are necessary to compete in today's challenging and tight job market. The non-profit faith-based community action agency offers internship trainings as well as classroom instruction. Economically disadvantaged and challenged individuals and potential employers can  use their services. Classes are  tailored to meet the needs of people in the [facility] region. [place], [place] [address], [phone]    Foreclosure prevention services Housing Counseling and Education is also offered by [employer] of the [place]. The agency (phone number is below) is a Engineer, structural providing foreclosure advice and counseling. They offer mortgage resolution counseling and also reverse mortgage counseling. Counselors can direct people to both [employer], as well as [employer] foreclosure assistance options.  Warehouse manager has locations in [place] and [employer]. They run debt and foreclosure prevention programs for local families. A sampling of the programs offered include both Budget and Housing Counseling. This includes money management, financial advice, budget review and development of a written action plan with a Pensions consultant to help solve specific individual financial problems. In addition, housing and mortgage counselors can also provide pre- and post-purchase homeownership counseling, default resolution counseling (to prevent foreclosure) and reverse mortgage counseling. A Debt Management Program allows people and families with a high level of credit card or medical debt to consolidate and repay consumer debt and loans to creditors and rebuild positive credit ratings and scores. [phone]  Debt assistance programs Receive free counseling and debt help from [facility] of the [place]. The [facility] based agency can be reached at [phone]. The counselors provide free help, and the services include budget counseling. This will help people manage their expenses and set goals. They also offer a Forensic scientist, which will help individuals consolidate their debts and become debt free. Most of the workshops and  services are free.  Community clinics in [place] of the leading health and dental centers are listed below. They may be able to provide medication, physicals, dental care, and general family care to residents of all incomes and backgrounds across the region. Some of the programs focus on the low income and underinsured. However if these clinics can't meet your needs, find information and details on more clinics in [facility].  Some of the options include [employer] of [employer]. This center provides free or low cost health care to low-income adults 18 - 64, who have no health insurance. Among other services offered include a pharmacy and eye clinic. Phone [phone]  [facility], which is located in [place], is a community clinic that provides primary medical and health care to uninsured and underinsured adults and families, as well as the low income, in the greater [place] area on a sliding-fee scale. Call [phone]  [place] [facility] - They run a dental assistance program that is organized by [facility] to provide dental services and aid to [employer]. Services offered by the dental clinic are limited to extractions, pain management, and minor restorative care. [phone]  [place] [facility] has locations in [facility] and [place]. The community clinics provide complete pediatric care including primary health, mental health, social work, neurology, cardiology, asthma. Dial [phone].  In addition to those [address] and [employer], find other free community clinics in [place] and across the county.  Food pantry and assistance Some of the local food pantries and distribution centers to call for free food and groceries include The [employer] of [place]  (phone [phone]), The [facility] (phone [phone]) and also [employer]. Dial [phone].  Several other food banks in the region  provide clothing, free food and meals, access to soup kitchens and other help. Find the addresses and phone numbers of more food pantries in [place]. http://www.needhelppayingbills.com/html/[place]_assistance_pro.html     [employer] The United Way's "B3979455" is a great source of information about community services available.  Access by dialing 2-1-1 from anywhere in [place], or by website -  PooledIncome.pl.  [facility]) now has a full time staff to take referrals for all individuals needing shelter or housing placement. They do not do direct services or have beds, but are in charge of assessing and coordinating placement for individuals needing shelter. The phone number for coordinated entry is [phone].    Other Armed forces technical officer Number and Address  [facility] for homeless and needy men with substance abuse issues 5 day Covid Quarantine [phone] [address] [place], [place]  [employer]man Sachs of [place] Emergency assistance for General Mills only Ingram [employer] Inc [phone] Ext. [address] [place], [place]  Clara Brunswick Corporation of the [place] Domestic violence shelter for women and their children [phone] [place], [place]  [facility] Domestic violence shelter for women and their children Each family gets their own unit and can quarantine after admission. [phone] , [place]  Interactive Resource Center [facility]) / Resources for the CIGNA center for the homeless Information and referral to housing resources Counseling Showers Laundry Barbershop Phone bank Mailroom Computer lab Medical clinic Bike maintenance center 14 Day covid quarantine [phone] [address] [place], [place]  Open Door  Ministries - [employer] Men's Shelter Emergency housing Food Emergency financial assistance Permanent supportive housing [phone] [address] [place], [place]  The Pathmark Stores Crisis assistance Medication Housing Food Utility assistance [phone] [address] [place], [place]   [phone] [address] [place], [place]  The Monsanto Company of [place]       Transitional housing Case Chartered certified accountant assistance [phone] [address] [place], [place]  [facility] House, Pitney Bowes for adult men and women Can admit with MD clearance from the hospital after positive covid test.  Intake Hotline [phone] [address] [place], [place]  24-hour Crisis Line for those Facing Homelessness   Information and referral to community resources [phone]  Graybar Electric and additional resources. Can admit with MD clearance after positive covid test.  Prefer online applications (blocked on Cone computers)/ currently full. Will be able to do intake at the office starting in May.  [phone] Admin only location   SUNDAYS BREAKFAST TWO LOCATIONS: 8:00am served in [facility] by Awaken PPL Corporation 8:30am SHUTTLE provided from [facility], served at Apache Corporation, [address] LUNCH TWO LOCATIONS [plus one additional third Sunday only] 10:30am - 12:30pm served at Ecolab, Liberty Global, [place] [address] (1.2 miles from [facility]) 12:30pm served in [place] by Land O'Lakes Team (THIRD Sunday only) 1:30pm served at [facility] by [facility] one location [plus one additional third Sunday only] 5:00pm Every Sunday, served under the bridge at [address] by Lindell Noe Under the 3M Company (.7 miles from [facility]) (THIRD Sunday ONLY) 4:00pm served in the parking garage,  across from Nucor Corporation, corner of [place] and [place] by Ryland Group Works Ministries MONDAYS BREAKFAST 7:30am served in Lehman Brothers  [place] by the United States Steel Corporation and Friends LUNCH 10:30am - 12:30pm served at Ecolab, Liberty Global, [place] [address] (1.2 miles from [facility]) DINNER TWO LOCATIONS: 7:00pm served in front of the courthouse at the corner of Goldman Sachs and International Business Machines. by [facility] Monday Night Meal (3 blocks from [facility]) 4:30pm served at the AutoNation, [address] by The Procter & Gamble Not Bombs (0.6 miles from [facility]) [place] BREAKFAST 8:00am - 9:00am served at The TJX Companies, [address] (0.3 miles from [place]) LUNCH 10:30am - 12:30pm served at the Ecolab, Liberty Global [address], (1.2 miles from [place]) DINNER 6:00pm served at CSX Corporation, enter from Capital One and go to the Sonic Automotive, (0.7 miles from [place]) [facility] BREAKFAST 7:00am - 8:00am served at Ecolab, Liberty Global [address] (1.2 miles from [place]) LUNCH ONE LOCATION [plus two additional locations listed below] 10:30am - 12:30pm served at Ecolab, Liberty Global [address] (1.2 miles from [place]) (FIRST Wednesday ONLY) 11:30am served at Dillard's, [place] [address] (6.6 miles from [place]) (SECOND Wednesday ONLY) 11:00am served at [place]. Melvyn Novas of [address], [address] (1.3 miles from [place]) [place] TWO LOCATIONS 6:00pm served at W. R. Berkley, [place] W. Visteon Corporation. (1.3 miles from [facility]) 4:00pm - 6:00pm (hot dogs and chips) served at Levi Strauss of [facility], [address] (1.7 miles from [place]) [place] BREAKFAST NOT AVAILABLE AT THIS TIME  LUNCH 10:30am - 12:30pm served at Ecolab, Liberty Global, [place] [address], (1.2 miles from [place]) DINNER 6:00pm served at CSX Corporation, enter from Capital One and go to the Sonic Automotive, (0.7 miles from Nucor Corporation) [place] BREAKFAST NOT AVAILABLE AT THIS TIME LUNCH 10:30am - 12:30pm served at Ecolab, Liberty Global [address], (1.2 miles from [place]) DINNER TWO LOCATIONS, [plus one additional first Friday only] 6:00pm served under the bridge at [address] by Lindell Noe Under CSX Corporation. (.7 miles from [facility]) 5:00pm - 7:00pm served at Levi Strauss of [facility], [address] (1.7 miles from [place]) (FIRST Friday ONLY) 5:45 pm - SHUTTLE provided from the LIBRARY at 5:45pm. Served at [facility], [address] [place]. SATURDAYS BREAKFAST TWO LOCATIONS [plus one additional last Saturday only] 8:00am served at [facility] by Delphi 8:30am served at Pulte Homes, [address]. (2.2 miles from Bgc Holdings Inc) (LAST Saturday ONLY) 8:30am served at Beazer Homes, [address] (5 miles from Loop) LUNCH 10:30am - 12:30pm served at Ecolab, Liberty Global [address] (1.2 miles from [facility]) DINNER 6:00pm served under the bridge at [address] by World Fuel Services Corporation (0.7 miles from Nucor Corporation)  DIRECTIONS FROM [place]ENTER [place] TO ALL MEAL LOCATIONS The Bridge at [address] (.7 miles from [address]) [address] on [place]. Turn Right onto DIRECTV 433 ft. Continue onto [address] under bridge, about 500 ft. Courthouse (3 blocks from [facility]) [place] on [address]. Turn right on [place] 1 block to  [address] First Cendant Corporation (.5 miles from Nucor Corporation) [place] on [place]. YRC Worldwide. past Brink's Company to EMCOR. Enter from Capital One and go to the Affiliated Computer Services building W. R. Berkley [address] Visteon Corporation. (1.3 miles from [facility]) [address] on [place]. Turn Right onto W. [address]. church will be on the Left. The TJX Companies [address] (.3 miles from [facility]) Go .3 miles on W. Friendly Destination is on your right Dillard's at ONEOK (6.6 miles from [address]) [address] on [place] toward W Friendly Turn right onto W Friendly Continue onto Alcoa Inc. Continue onto Toll Brothers. 5. Elesa Hacker is on right [facility] Conseco) [address] (.6 miles from [address]) [place] on [place]. [address] Turn Left onto [address] 0.3 miles Destination is on the Left. Muirs [place] Black & Decker at American Express (5 miles from Nucor Corporation) 1. Head south on [address]. Turn right onto W Friendly Turn slightly left onto Quest Diagnostics Continue onto Quest Diagnostics Turn right at Barnes & Noble Continue to church on right New Birth Sounds of [facility] [address] (1.7 miles from [place]) [address] on [place] 1.4 miles [place] becomes [place]. Elm [address] Continue 0.6 miles and church will be on theright. Northside Guardian Life Insurance at [address] (2.5 miles from Nucor Corporation) [place] provided from Massachusetts Mutual Life Park] [place] on [place]. Elm toward Estée Lauder right onto Costco Wholesale left onto Henry Schein left onto Micron Technology [address] Southern Company (2.2 miles from [address]) [address] on [place] 1.4 miles [place] becomes [place]. Elm [address] Turn right onto [address]. [address] and church will be on the Left. Potter's House/[place] AT&T [address] (1.2 miles from [facility]) 1.Turn right onto [facility] 2.Turn left onto Rogue Jury 3.Reino [place] 4.Destination is on your right [place]. Melvyn Novas of [address] at ToysRus (1.3 miles from [facility]) [address] on [address] Turn left onto Genuine Parts right onto S. Quentin [place]. Continue onto KB Home	[place]. Turn left onto Smurfit-Stone Container. Turn right onto WellPoint.  Low Income Public Housing  [facility] [place] Supported Living Apts [phone] W. Joellyn Quails., [facility] [phone]  [facility] [address], [place] [phone]  [facility] Campsu Ind [address] [place] [address]., [place] [phone]  [facility] [address] [place]. [place] [phone]  Northland Apts. [address] [place]., [place] [phone]  Parkside Apts.  [address] [place] [phone]  Dorothe Pea Homes [address], [place] [phone]  [facility] [address] Silvio Pate [phone]  [facility] [address] [place] [phone]  THP Apts. [address]  [facility] [phone]  [facility]  [address] [place] [phone]  [place] Apts.  [address] [facility] [address] Ginette Otto [phone]   [place] Apts. II [address] Ginette Otto [phone]  [place]oint[place] Housing [address] Ginette Otto [phone] [address] [place] [phone]  [facility] Apts. [address], Ginette Otto [phone]  Gardeng[place] Apts. [address] [facility] [address] Ginette Otto [phone]  [facility] Apts. [address] [place] [phone]  Rockwell Automation Apts.  [address]  [address], [place] [phone]  [facility] Apts. [address], [place] [phone] Apts.  [address] [place] [phone]  [facility] Apts. [address] [place] [phone]  [place] Apts.  [address] [facility] [phone]                  Intensive Outpatient  Programs  [facility]    [facility] [address]     [address] [place],  [place]     [place], [place] [phone]      [phone]  [facility]   [facility]  (Inpatient and outpatient)  [phone] (Suboxone and Methadone) [address]           [phone]           ADS: Alcohol & Drug Services    Insight Programs - Intensive Outpatient [address]     [address] [place], [place] [address]     [place], [place]  [phone]      [phone]  Fellowship Margo Aye (Outpatient, Inpatient, Chemical  Caring Services (Groups and Residental) (insurance only) [phone]    [place], [place]          [phone]       [facility]    Al-Con Counseling (for caregivers and family) [address] [place] [address]     [address] [place], [place]     [place], [place] [phone]      [phone]  Residential Treatment Programs  [facility] Rescue Mission  Work Farm(2 years) Residential: 90 days)  [facility] ([facility] 700 [facility]      [address] [place], [place]     [place], [place] [phone]      [phone] or [phone]  [facility]    The [facility] [address]      [address] [place], [place]     [place], [place] [phone]      [phone]  [facility]   Residential Treatment Services (RTS) [address]     [address] [place], [place] [address]     [place], [place] [phone]      [phone] Admissions: 8am-3pm M-F  BATS Program: Residential Program [phone] Days)              ADATC: [facility]  [place], [place]     [place], [place]  [phone] or [phone]    (Walk in Hours over the weekend or by referral)   Mobil Crisis: Therapeutic Alternatives:[phone] (for crisis response 24 hours a day)

## 2022-11-20 NOTE — Plan of Care (Signed)
FMTS Interim Progress Note  S: Patient seen post-op. Patient is having R sided abdominal pain. He found the toradol most helpful for his pain so far. He has been able to eat, without nausea or vomiting. He has had a normal bowel movement since the procedure. No chest pain, difficulty breathing, headache, or vision changes.   O: BP 128/89 (BP Location: Left Arm)   Pulse 65   Temp 97.8 F (36.6 C) (Oral)   Resp 17   SpO2 100%   General: Well-appearing. Resting comfortably in room. CV: Normal S1/S2. No extra heart sounds. Warm and well-perfused. Pulm: Breathing comfortably on room air. CTAB. No increased WOB. Abd: BS present. Soft. Tender to palpation of R side. No guarding or rebound. Incisions remains closed with skin glue without bleeding or erythema.  Skin:  Warm, dry.  A/P: Cholecystitis: Hemodynamically stable now s/p cholecystectomy. Patient with R sided abdominal pain consistent with recent procedure. Incision sites without signs of infection.  - Continue pain regimen   - Oxycodone 10 q4 prn   - Toradol 30 q6 prn   - Tylenol 650 q6 scheduled  - Discussed that the patient would need to ask for the medications that are scheduled as prn. Patient was not previously aware of this. Following discussion, patient expressed understanding regarding prn medications.  - Fu surgery recs  Possible substance use during admission  Discussed concerns of possible external items as indicated in the 11/11 note by Dr Laroy Apple. Patient and patient's wife are not aware of any of this and do not know how this could have happened. Discussed patient safety measures we consider, including limiting visitors or having a sitter. Patient prefers to have a sitter in the room and be with his wife. Discussed the dangers of combining outside substances with inpatient medications. Patient expressed understanding.  - Floor nursing team is attempting to arrange a sitter.  - Naloxone ordered PRN  - Jodelle Gross,  MD 11/20/2022, 2:42 PM PGY-1, [facility] Family Medicine Service pager [phone]

## 2022-11-20 NOTE — Assessment & Plan Note (Signed)
Right upper quadrant ultrasound with positive sonographic Murphy sign and cholelithiasis. Lipase negative. S/p 3.375 Zosyn in the ED. WBC downtrending 16.0 >11.4 . Patient remains afebrile. Gen Surg planning for cholecystectomy this morning.  - Post-op check  - Continue plan below post-op:  - Tylenol 1000 q6 prn for mild or moderate pain - Toradol 30 q6 prn for sever pain   - Zofran 4 prn  - Miralax prn  - AM CBC, CMP - replete electrolytes as needed

## 2022-11-20 NOTE — Interval H&P Note (Signed)
History and Physical Interval Note:  11/20/2022 7:34 AM  Brendan Sutton  has presented today for surgery, with the diagnosis of cholecystitis.  The various methods of treatment have been discussed with the patient and family. After consideration of risks, benefits and other options for treatment, the patient has consented to  Procedure(s): LAPAROSCOPIC CHOLECYSTECTOMY (N/A) as a surgical intervention.  The patient's history has been reviewed, patient examined, no change in status, stable for surgery.  I have reviewed the patient's chart and labs.  Questions were answered to the patient's satisfaction.   The procedure has been discussed with the patient. Operative and non operative treatments have been discussed. Risks of surgery include bleeding, infection,  Common bile duct injury,  Injury to the stomach,liver, colon,small intestine, abdominal wall,  Diaphragm,  Major blood vessels,  And the need for an open procedure.  Other risks include worsening of medical problems, death,  DVT and pulmonary embolism, and cardiovascular events.   Medical options have also been discussed. The patient has been informed of long term expectations of surgery and non surgical options,  The patient agrees to proceed.     Evita Merida A Brayli Klingbeil

## 2022-11-20 NOTE — Plan of Care (Signed)
Patient alert/oriented X4. Patient compliant with medication administration and 1:1 sitter initiated due to suspicious illicit drug usage in room. MD aware, oxycodone administered as needed as needed for pain along with Toradol. Patient complains of moderate pain in abdominal area, VSS.   Problem: Education: Goal: Knowledge of General Education information will improve Description: Including pain rating scale, medication(s)/side effects and non-pharmacologic comfort measures Outcome: Progressing   Problem: Health Behavior/Discharge Planning: Goal: Ability to manage health-related needs will improve Outcome: Progressing   Problem: Clinical Measurements: Goal: Ability to maintain clinical measurements within normal limits will improve Outcome: Progressing   Problem: Clinical Measurements: Goal: Will remain free from infection Outcome: Progressing   Problem: Clinical Measurements: Goal: Diagnostic test results will improve Outcome: Progressing   Problem: Clinical Measurements: Goal: Cardiovascular complication will be avoided Outcome: Progressing   Problem: Activity: Goal: Risk for activity intolerance will decrease Outcome: Progressing   Problem: Nutrition: Goal: Adequate nutrition will be maintained Outcome: Progressing   Problem: Coping: Goal: Level of anxiety will decrease Outcome: Progressing   Problem: Elimination: Goal: Will not experience complications related to bowel motility Outcome: Progressing   Problem: Elimination: Goal: Will not experience complications related to urinary retention Outcome: Progressing   Problem: Pain Management: Goal: General experience of comfort will improve Outcome: Progressing   Problem: Safety: Goal: Ability to remain free from injury will improve Outcome: Progressing   Problem: Skin Integrity: Goal: Risk for impaired skin integrity will decrease Outcome: Progressing

## 2022-11-20 NOTE — Assessment & Plan Note (Signed)
Last used a few days prior to admission. Patient had been intermittently taking his girlfriends Subutex prior to admission. S/p Suboxone 8-2mg  2 tablet in the ED. History of Hep C on previous admission - unclear if treated. HIV negative. COWS 0-2 over last day. Suspicion for external substance use during admission - see nighttime brief interim note from 11/11.  - Continue plan below post-op:  - Suboxone 8-2mg  2 tablet daily (titrate with symptoms) - COWS q4h - Aanalgesic and antiemetic as above - TOC consulted for substance use counseling - HCV Ab - collected and pending

## 2022-11-20 NOTE — Transfer of Care (Signed)
Immediate Anesthesia Transfer of Care Note  Patient: Brendan Sutton  Procedure(s) Performed: LAPAROSCOPIC CHOLECYSTECTOMY (Abdomen)  Patient Location: PACU  Anesthesia Type:General  Level of Consciousness: awake, drowsy, and patient cooperative  Airway & Oxygen Therapy: Patient Spontanous Breathing and Patient connected to face mask oxygen  Post-op Assessment: Report given to RN and Post -op Vital signs reviewed and stable  Post vital signs: Reviewed and stable  Last Vitals:  Vitals Value Taken Time  BP 142/88 11/20/22 0930  Temp 36.5 C 11/20/22 0930  Pulse 79 11/20/22 0930  Resp 16 11/20/22 0930  SpO2 100 % 11/20/22 0930  Vitals shown include unfiled device data.  Last Pain:  Vitals:   11/20/22 0740  TempSrc: Oral  PainSc: 0-No pain      Patients Stated Pain Goal: 0 (11/19/22 1707)  Complications: No notable events documented.

## 2022-11-20 NOTE — Progress Notes (Signed)
     Daily Progress Note Intern Pager: [phone]  Patient name: Brendan Sutton Medical record number: [id number] Date of birth: 09-04-1985 Age: 37 y.o. Gender: male  Primary Care Provider: Patient, No Pcp Per Consultants: Gen Surg Code Status: Full    Pt Overview and Major Events to Date:  11/10: Admitted, briefly left AMA 11/11: Admitted again  11/12: Cholecystectomy    Assessment and Plan:   Brendan Sutton is a 37 y.o. 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 Most consistent with acute cholecystitis given + murphy's sign and cholelithiasis on RUQ ultrasound. Planning for cholecystectomy today.  Assessment & Plan Acute cholecystitis Right upper quadrant ultrasound with positive sonographic Murphy sign and cholelithiasis. Lipase negative. S/p 3.375 Zosyn in the ED. WBC downtrending 16.0 >11.4 . Patient remains afebrile. Gen Surg planning for cholecystectomy this morning.  - Post-op check  - Continue plan below post-op:  - Tylenol 1000 q6 prn for mild or moderate pain - Toradol 30 q6 prn for sever pain   - Zofran 4 prn  - Miralax prn  - AM CBC, CMP - replete electrolytes as needed  Heroin withdrawal (HCC) Last used a few days prior to admission. Patient had been intermittently taking his girlfriends Subutex prior to admission. S/p Suboxone 8-2mg  2 tablet in the ED. History of Hep C on previous admission - unclear if treated. HIV negative. COWS 0-2 over last day. Suspicion for external substance use during admission - see nighttime brief interim note from 11/11.  - Continue plan below post-op:  - Suboxone 8-2mg  2 tablet daily (titrate with symptoms) - COWS q4h - Aanalgesic and antiemetic as above - TOC consulted for substance use counseling - HCV Ab - collected and pending   FEN/GI: NPO for cholecystectomy today PPx:  SCDs Dispo: Home pending clinical improvement   Subjective:  Patient in OR this morning.   Objective: Temp:  [97.8 F (36.6 C)-99.8 F (37.7 C)] 97.8 F (36.6 C) (11/12 0740) Pulse Rate:  [74-96] 75 (11/12 0740) Resp:  [17-19] 17 (11/12 0740) BP: (102-136)/(49-97) 120/84 (11/12 0740) SpO2:  [96 %-99 %] 98 % (11/12 0740) Physical Exam: Patient in OR this morning.   Laboratory: Most recent CBC Lab Results  Component Value Date   WBC 11.4 (H) 11/19/2022   HGB 12.2 (L) 11/19/2022   HCT 36.4 (L) 11/19/2022   MCV 84.5 11/19/2022   PLT 425 (H) 11/19/2022   Most recent BMP    Latest Ref Rng & Units 11/19/2022   12:15 PM  BMP  Glucose 70 - 99 mg/dL 562   BUN 6 - 20 mg/dL 16   Creatinine 1.30 - 1.24 mg/dL 8.65   Sodium 784 - 696 mmol/L 133   Potassium 3.5 - 5.1 mmol/L 3.2   Chloride 98 - 111 mmol/L 98   CO2 22 - 32 mmol/L 22   Calcium 8.9 - 10.3 mg/dL 8.6     Ivery Quale, MD 11/20/2022, 8:22 AM  PGY-1, [facility] Family Medicine FPTS Intern pager: [phone], text pages welcome Secure chat group [facility] Teaching Service

## 2022-11-21 ENCOUNTER — Other Ambulatory Visit (HOSPITAL_COMMUNITY): Payer: Self-pay

## 2022-11-21 ENCOUNTER — Encounter (HOSPITAL_COMMUNITY): Payer: Self-pay | Admitting: Surgery

## 2022-11-21 LAB — BASIC METABOLIC PANEL
Anion gap: 8 (ref 5–15)
BUN: 12 mg/dL (ref 6–20)
CO2: 23 mmol/L (ref 22–32)
Calcium: 8.5 mg/dL — ABNORMAL LOW (ref 8.9–10.3)
Chloride: 103 mmol/L (ref 98–111)
Creatinine, Ser: 0.8 mg/dL (ref 0.61–1.24)
GFR, Estimated: >60 mL/min (ref >60)
Glucose, Bld: 97 mg/dL (ref 70–99)
Potassium: 3.7 mmol/L (ref 3.5–5.1)
Sodium: 134 mmol/L — ABNORMAL LOW (ref 135–145)

## 2022-11-21 LAB — CBC
HCT: 35.2 % — ABNORMAL LOW (ref 39.0–52.0)
Hemoglobin: 11.6 g/dL — ABNORMAL LOW (ref 13.0–17.0)
MCH: 27.7 pg (ref 26.0–34.0)
MCHC: 33 g/dL (ref 30.0–36.0)
MCV: 84 fL (ref 80.0–100.0)
Platelets: 463 10*3/uL — ABNORMAL HIGH (ref 150–400)
RBC: 4.19 MIL/uL — ABNORMAL LOW (ref 4.22–5.81)
RDW: 13.4 % (ref 11.5–15.5)
WBC: 16.4 10*3/uL — ABNORMAL HIGH (ref 4.0–10.5)
nRBC: 0 % (ref 0.0–0.2)

## 2022-11-21 LAB — SURGICAL PATHOLOGY

## 2022-11-21 MED ORDER — ENOXAPARIN SODIUM 40 MG/0.4ML IJ SOSY: 40.0000 mg | PREFILLED_SYRINGE | INTRAMUSCULAR | Status: DC

## 2022-11-21 MED ORDER — NICOTINE 14 MG/24HR TD PT24
14.0000 mg | MEDICATED_PATCH | Freq: Every day | TRANSDERMAL | 0 refills | Status: AC
  Filled 2022-11-21: qty 28, 28d supply, fill #0

## 2022-11-21 MED ORDER — ACETAMINOPHEN 500 MG PO TABS: 1000.0000 mg | ORAL_TABLET | Freq: Four times a day (QID) | ORAL | Status: AC

## 2022-11-21 MED ORDER — OXYCODONE HCL 10 MG PO TABS: 10.0000 mg | ORAL_TABLET | Freq: Four times a day (QID) | ORAL | 0 refills | Status: AC | PRN

## 2022-11-21 MED ORDER — IBUPROFEN 200 MG PO TABS: 600.0000 mg | ORAL_TABLET | Freq: Three times a day (TID) | ORAL | Status: AC | PRN

## 2022-11-21 MED ORDER — ACETAMINOPHEN 500 MG PO TABS: 1000.0000 mg | ORAL_TABLET | Freq: Four times a day (QID) | ORAL | Status: DC

## 2022-11-21 MED ORDER — IBUPROFEN 200 MG PO TABS: 600.0000 mg | ORAL_TABLET | Freq: Three times a day (TID) | ORAL | Status: DC

## 2022-11-21 MED ORDER — NALOXONE HCL 4 MG/0.1ML NA LIQD
0.4000 mg | NASAL | 1 refills | Status: AC | PRN
  Filled 2022-11-21: qty 2, 30d supply, fill #0

## 2022-11-21 NOTE — Assessment & Plan Note (Deleted)
Right upper quadrant ultrasound with positive sonographic Murphy sign and cholelithiasis.  General surgery consulted. S/p 3.375 Zosyn in the ED. Initial WBC of 16.0. Remains afebrile.  - Gen surg to see patient today. NPO at [facility] for possible procedure tomorrow. - Plan to adjust suboxone as needed iso possible procedure  - Tylenol 1000 q6 prn for mild or moderate pain - Toradol 30 q6 prn for sever pain   - Zofran 4 prn  - Miralax prn  - AM CBC, CMP - Consider CT Abdomen for worsening symptoms

## 2022-11-21 NOTE — Assessment & Plan Note (Deleted)
Right upper quadrant ultrasound with positive sonographic Murphy sign and cholelithiasis. Lipase negative. S/p 3.375 Zosyn in the ED. WBC downtrending 16.0 >11.4 . Patient remains afebrile. Gen Surg planning for cholecystectomy this morning.  - Post-op check  - Continue plan below post-op:  - Tylenol 1000 q6 prn for mild or moderate pain - Toradol 30 q6 prn for sever pain   - Zofran 4 prn  - Miralax prn  - AM CBC, CMP - replete electrolytes as needed   Hemodynamically stable. Hgb stable. BMP unremarkable.

## 2022-11-21 NOTE — Discharge Summary (Cosign Needed Addendum)
Family Medicine Teaching [facility] Discharge Summary  Patient name: Brendan Sutton Medical record number: [id number] Date of birth: [birth year] Age: 37 y.o. 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 His hospital course is detailed below:  Abdominal pain RUQ [place] with positive sonographic murphy sign and cholelithiasis. Pt had laparoscopic cholecystectomy on 11/12 with no complications.  Postop pain was controlled with oxycodone which he was discharged on and follow-up was arranged with general surgery before discharge.   Heroin withdrawal Last used 4 days prior to hospital admission, intermittently used girlfriend's subutex and suboxone in that time. Pt was treated for withdrawal with Suboxone 8-2mg , titrated based on symptoms. Suboxone was discontinued 11/20/22 as patient was started on oxycodone for postop pain.  There was also concern for heroin use in the hospital as syringes were found in the trash can. TOC provided substance abuse resources.   Other chronic conditions were medically managed with home medications and formulary alternatives as necessary   PCP Follow-up Recommendations: Substance use counseling Ensure follow-up outpatient with general surgery Follow up  Hep C panel   Disposition: Possible housing resource  Discharge Condition: Improved, stable  Discharge Exam:  Vitals:   11/21/22 0545 11/21/22 0858  BP: (!) 141/75 (!) 154/98  Pulse: 75 81  Resp: 17 18  Temp: 98.4 F (36.9 C) 98.4 F (36.9 C)  SpO2: 92% 99%   General: Well-appearing. Resting comfortably in room. CV: Normal S1/S2. No extra heart sounds. Warm and well-perfused. Pulm: Breathing comfortably on room air. CTAB. No increased WOB. Abd: BS present. Soft. Tender to palpation of R side. No guarding or rebound. Incisions remains closed with skin glue without bleeding or erythema.  Skin:  Warm, dry. Psych: Pleasant and appropriate.   Significant Procedures: Cholecystectomy (11/20/22)  Significant Labs and Imaging:  Recent Labs  Lab 11/20/22 1048 11/21/22 0508  WBC 14.7* 16.4*  HGB 11.9* 11.6*  HCT 36.6* 35.2*  PLT 444* 463*   Recent Labs  Lab 11/20/22 1048 11/21/22 0508  NA 136 134*  K 4.3 3.7  CL 104 103  CO2 23 23  GLUCOSE 106* 97  BUN 10 12  CREATININE 0.77 0.80  CALCIUM 8.5* 8.5*   RUQ [place]: Cholelithiasis and positive sonographic Murphy sign   Results/Tests Pending at Time of Discharge: Hep C panel  Discharge Medications:  Allergies as of 11/21/2022   No Known Allergies      Medication List     TAKE these medications    acetaminophen 500 MG tablet Commonly known as: TYLENOL Take 2 tablets (1,000 mg total) by mouth every 6 (six) hours.   ibuprofen 200 MG tablet Commonly known as: Motrin IB Take 3 tablets (600 mg total) by mouth every 8 (eight) hours as needed.   naloxone 4 MG/0.1ML Liqd nasal spray kit Commonly known as: NARCAN Use 1 spray (  0.4 mg total) as needed in the nose.   nicotine 14 mg/24hr patch Commonly known as: NICODERM CQ - dosed in mg/24 hours Place 1 patch (14 mg total) onto the skin daily. Start taking on: November 22, 2022   Oxycodone HCl 10 MG Tabs Take 1 tablet (10 mg total) by mouth every 6 (six) hours as  needed.        Discharge Instructions: Please refer to Patient Instructions section of EMR for full details.  Patient was counseled important signs and symptoms that should prompt return to medical care, changes in medications, dietary instructions, activity restrictions, and follow up appointments.   Follow-Up Appointments:  Follow-up Information     Grayce Sessions, NP Follow up.   Specialty: Internal Medicine Why: TIME : 2:55 PM DATE : DECEMBER 02 , 2024 LOCATION : [facility] Contact information: [address] [place] [address] [phone]         Harriette Bouillon, MD Follow up on 12/14/2022.   Specialty: General Surgery Why: 10:40am, Arrive 30 minutes prior to your appointment time, Please bring your insurance card and photo ID Contact information: [address] [place] [address] [phone]                 Ivery Quale, MD 11/21/2022, 3:43 PM PGY-1, [facility]  I reviewed the medical decision making and verified the service and findings are accurately documented in the intern's note.  Erick Alley, DO 11/21/2022 6:14 PM

## 2022-11-21 NOTE — TOC Progression Note (Addendum)
Transition of Care [facility]) - Progression Note    Patient Details  Name: JI FILA MRN: [id number] Date of Birth: [birth year]  Transition of Care [facility]) CM/SW Contact  Marliss Coots, LCSW Phone Number: 11/21/2022, 9:22 AM  Clinical Narrative:     9:23 AM This CSW introduced themself and their role to patient at bedside. CSW also provided patient with three bus passes. Patient stated that their wife wanted to speak with social work before discharge. CSW relayed that they will return to patient bedside before discharge.  10:00 AM This CSW passed by patient and wife in hallway. They informed this CSW that they were going to the cafeteria but would be returning to patient room. Patient asked for additional bus passes.  10:30AM This CSW provided two additional bus passes to patient and patient's wife at bedside. Patient's wife asked for housing resources, such as hotel vouchers and shelters. CSW informed patient and patient's wife that hotel vouchers were unable to be provided but offered a handout of local housing resources. Patient and patient's wife understood this information and accepted local housing resource list.  10:45AM This CSW returned to patients bedside to provide patient with local housing resource list. List was provided to patient.  Expected Discharge Plan: Home/Self Care Barriers to Discharge: Continued Medical Work up  Expected Discharge Plan and Services In-house Referral: PCP / [facility]     Living arrangements for the past 2 months: Homeless                                       Social Determinants of Health (SDOH) Interventions SDOH Screenings   Tobacco Use: High Risk (11/20/2022)    Readmission Risk Interventions     No data to display

## 2022-11-21 NOTE — Assessment & Plan Note (Deleted)
Last used a few days prior to admission. Patient had been intermittently taking his girlfriends Subutex prior to admission. S/p Suboxone 8-2mg  2 tablet in the ED. History of Hep C on previous admission - unclear if treated. HIV negative. COWS 0-2 over last day. Suspicion for external substance use during admission - see nighttime brief interim note from 11/11.  - Continue plan below post-op:  - Suboxone 8-2mg  2 tablet daily (titrate with symptoms) - COWS q4h - Aanalgesic and antiemetic as above - TOC consulted for substance use counseling - HCV Ab - collected and pending

## 2022-11-21 NOTE — Anesthesia Postprocedure Evaluation (Signed)
Anesthesia Post Note  Patient: Brendan Sutton  Procedure(s) Performed: LAPAROSCOPIC CHOLECYSTECTOMY (Abdomen)     Patient location during evaluation: PACU Anesthesia Type: General Level of consciousness: awake and alert Pain management: pain level controlled Vital Signs Assessment: post-procedure vital signs reviewed and stable Respiratory status: spontaneous breathing, nonlabored ventilation and respiratory function stable Cardiovascular status: blood pressure returned to baseline and stable Postop Assessment: no apparent nausea or vomiting Anesthetic complications: no   No notable events documented.              Oshua Mcconaha

## 2022-11-21 NOTE — Progress Notes (Signed)
    1 Day Post-Op  Subjective: Having some soreness and pain today as expected.  Eating well with no complaints of n/v.  Voiding.   Objective: Vital signs in last 24 hours: Temp:  [97.7 F (36.5 C)-99.2 F (37.3 C)] 98.4 F (36.9 C) (11/13 0545) Pulse Rate:  [57-96] 75 (11/13 0545) Resp:  [8-18] 17 (11/13 0545) BP: (111-148)/(75-101) 141/75 (11/13 0545) SpO2:  [92 %-100 %] 92 % (11/13 0545) Last BM Date : 11/19/22  Intake/Output from previous day: 11/12 0701 - 11/13 0700 In: 1080 [P.O.:480; I.V.:500; IV Piggyback:100] Out: -  Intake/Output this shift: No intake/output data recorded.  PE: Abd: soft, appropriately tender, +BS, ND, incisions c/d/I with dermabond  Lab Results:  Recent Labs    11/20/22 1048 11/21/22 0508  WBC 14.7* 16.4*  HGB 11.9* 11.6*  HCT 36.6* 35.2*  PLT 444* 463*   BMET Recent Labs    11/20/22 1048 11/21/22 0508  NA 136 134*  K 4.3 3.7  CL 104 103  CO2 23 23  GLUCOSE 106* 97  BUN 10 12  CREATININE 0.77 0.80  CALCIUM 8.5* 8.5*   PT/INR No results for input(s): "LABPROT", "INR" in the last 72 hours. CMP     Component Value Date/Time   NA 134 (L) 11/21/2022 0508   K 3.7 11/21/2022 0508   CL 103 11/21/2022 0508   CO2 23 11/21/2022 0508   GLUCOSE 97 11/21/2022 0508   BUN 12 11/21/2022 0508   CREATININE 0.80 11/21/2022 0508   CALCIUM 8.5 (L) 11/21/2022 0508   PROT 6.4 (L) 11/19/2022 1215   ALBUMIN 3.1 (L) 11/19/2022 1215   AST 18 11/19/2022 1215   ALT 27 11/19/2022 1215   ALKPHOS 63 11/19/2022 1215   BILITOT 0.3 11/19/2022 1215   GFRNONAA >60 11/21/2022 0508   GFRAA >60 06/11/2018 2035   Lipase     Component Value Date/Time   LIPASE 27 11/18/2022 1614       Studies/Results: No results found.  Anti-infectives: Anti-infectives (From admission, onward)    Start     Dose/Rate Route Frequency Ordered Stop   11/19/22 1500  cefTRIAXone (ROCEPHIN) 2 g in sodium chloride 0.9 % 100 mL IVPB        2 g 200 mL/hr over 30  Minutes Intravenous Every 24 hours 11/19/22 1326 11/20/22 0402   11/19/22 0600  piperacillin-tazobactam (ZOSYN) IVPB 3.375 g  Status:  Discontinued        3.375 g 12.5 mL/hr over 240 Minutes Intravenous Every 8 hours 11/19/22 0214 11/19/22 0634   11/19/22 0230  piperacillin-tazobactam (ZOSYN) IVPB 3.375 g        3.375 g 100 mL/hr over 30 Minutes Intravenous  Once 11/19/22 0215 11/19/22 0439        Assessment/Plan POD 1, s/p lap chole by Dr. Luisa Hart for acute cholecystitis 11/12 -doing well post op -tolerating a diet -scheduled tylenol, ibuprofen, and prn oxy -surgically stable for DC home -follow up has been arranged -relayed to the primary service.   FEN - regular VTE - Lovenox ID - no further abx warranted    LOS: 0 days    Letha Cape , [facility] Surgery 11/21/2022, 8:44 AM Please see Amion for pager number during day hours 7:00am-4:30pm or 7:00am -11:30am on weekends

## 2022-11-23 LAB — HCV RT-PCR, QUANT (NON-GRAPH)
HCV log10: 4.474 {Log}
Hepatitis C Quantitation: 29800 [IU]/mL

## 2022-11-23 LAB — HCV AB W REFLEX TO QUANT PCR: HCV Ab: REACTIVE — AB

## 2022-11-28 ENCOUNTER — Encounter (HOSPITAL_COMMUNITY): Payer: Self-pay

## 2022-11-28 ENCOUNTER — Other Ambulatory Visit: Payer: Self-pay

## 2022-11-28 ENCOUNTER — Emergency Department (HOSPITAL_COMMUNITY)
Admission: EM | Admit: 2022-11-28 | Discharge: 2022-11-28 | Disposition: A | Discharge status: Home / Self Care | Payer: 59 | Attending: Emergency Medicine | Admitting: Emergency Medicine

## 2022-11-28 DIAGNOSIS — G8918 Other acute postprocedural pain: Secondary | ICD-10-CM | POA: Insufficient documentation

## 2022-11-28 DIAGNOSIS — R112 Nausea with vomiting, unspecified: Secondary | ICD-10-CM | POA: Insufficient documentation

## 2022-11-28 DIAGNOSIS — R1011 Right upper quadrant pain: Secondary | ICD-10-CM | POA: Insufficient documentation

## 2022-11-28 DIAGNOSIS — R509 Fever, unspecified: Secondary | ICD-10-CM | POA: Diagnosis not present

## 2022-11-28 MED ORDER — MORPHINE SULFATE (PF) 4 MG/ML IV SOLN: 4.0000 mg | Freq: Once | INTRAVENOUS | Status: DC

## 2022-11-28 NOTE — ED Notes (Signed)
Pt was stuck twice. Wasn't successful.

## 2022-11-28 NOTE — ED Notes (Signed)
Patient told doctor that he was leaving. Pt was in hallway and did not want to wait any longer. Doctor is aware. Recent orders were discontinued.

## 2022-11-28 NOTE — ED Provider Notes (Signed)
[place] EMERGENCY DEPARTMENT AT [facility] Provider Note   CSN: [id number] Arrival date & time: 11/28/22  1616     History {Add pertinent medical, surgical, social history, OB history to HPI:1} Chief Complaint  Patient presents with   Post-op Problem    Brendan Sutton is a 37 y.o. male.  HPI 37 year old male history of prior endocarditis, he remains, hep C, recent mission for cholecystitis presenting for abdominal pain.  Patient had laparoscopic cholecystectomy on the 12th of this month by Dr. Luisa Hart.  He was treated for heroin withdrawal at the time as well but was discharged with oxycodone.  Patient ran out of pain medication couple days ago.  He reports his abdominal pain and right upper quadrant worsened today.  He has had some nausea and vomiting as well.  Similar to prior cholecystitis.  No change in bowel movements.  No diarrhea.  No lower abdominal pain.  Reports subjective fevers.  No chest pain or shortness of breath.     Home Medications Prior to Admission medications   Medication Sig Start Date End Date Taking? Authorizing Provider  acetaminophen (TYLENOL) 500 MG tablet Take 2 tablets (1,000 mg total) by mouth every 6 (six) hours. 11/21/22   Barnetta Chapel, PA-C  ibuprofen (MOTRIN IB) 200 MG tablet Take 3 tablets (600 mg total) by mouth every 8 (eight) hours as needed. 11/21/22 11/21/23  Barnetta Chapel, PA-C  naloxone [facility]) nasal spray 4 mg/0.1 mL Use 1 spray (0.4 mg total) as needed in the nose. 11/21/22   Celine Mans, MD  nicotine (NICODERM CQ - DOSED IN MG/24 HOURS) 14 mg/24hr patch Place 1 patch (14 mg total) onto the skin daily. 11/22/22   Celine Mans, MD  oxyCODONE 10 MG TABS Take 1 tablet (10 mg total) by mouth every 6 (six) hours as needed. 11/21/22   Barnetta Chapel, PA-C      Allergies    Patient has no known allergies.    Review of Systems   Review of Systems Review of systems completed and notable as per HPI.  ROS  otherwise negative.   Physical Exam Updated Vital Signs BP (!) 127/91 (BP Location: Right Arm)   Pulse 84   Temp 98.1 F (36.7 C) (Oral)   Resp 17   Ht 5\' 9"  (1.753 m)   Wt 70.8 kg   SpO2 98%   BMI 23.05 kg/m  Physical Exam Vitals and nursing note reviewed.  Constitutional:      General: He is not in acute distress.    Appearance: He is well-developed.  HENT:     Head: Normocephalic and atraumatic.  Eyes:     Conjunctiva/sclera: Conjunctivae normal.  Cardiovascular:     Rate and Rhythm: Normal rate and regular rhythm.     Pulses: Normal pulses.     Heart sounds: Normal heart sounds. No murmur heard. Pulmonary:     Effort: Pulmonary effort is normal. No respiratory distress.     Breath sounds: Normal breath sounds.  Abdominal:     Palpations: Abdomen is soft.     Tenderness: There is abdominal tenderness. There is no guarding or rebound.     Comments: Incisions well healing.  RUQ TTP.  Musculoskeletal:        General: No swelling.     Cervical back: Neck supple.     Right lower leg: No edema.     Left lower leg: No edema.  Skin:    General: Skin is warm and dry.  Capillary Refill: Capillary refill takes less than 2 seconds.  Neurological:     Mental Status: He is alert.  Psychiatric:        Mood and Affect: Mood normal.     ED Results / Procedures / Treatments   Labs (all labs ordered are listed, but only abnormal results are displayed) Labs Reviewed  CBC WITH DIFFERENTIAL/PLATELET  COMPREHENSIVE METABOLIC PANEL  LIPASE, BLOOD    EKG None  Radiology No results found.  Procedures Procedures  {Document cardiac monitor, telemetry assessment procedure when appropriate:1}  Medications Ordered in ED Medications - No data to display  ED Course/ Medical Decision Making/ A&P   {   Click here for ABCD2, HEART and other calculatorsREFRESH Note before signing :1}                              Medical Decision Making Amount and/or Complexity of Data  Reviewed Labs: ordered.   Medical Decision Making:   [name] is a 37 y.o. male who presented to the ED today with    {crccomplexity:27900} Reviewed and confirmed nursing documentation for past medical history, family history, social history.  Reassessment and Plan:   ***    Patient's presentation is most consistent with {EM COPA:27473}     {Document critical care time when appropriate:1} {Document review of labs and clinical decision tools ie heart score, Chads2Vasc2 etc:1}  {Document your independent review of radiology images, and any outside records:1} {Document your discussion with family members, caretakers, and with consultants:1} {Document social determinants of health affecting pt's care:1} {Document your decision making why or why not admission, treatments were needed:1} Final Clinical Impression(s) / ED Diagnoses Final diagnoses:  None    Rx / DC Orders ED Discharge Orders     None

## 2022-11-28 NOTE — ED Triage Notes (Signed)
Pt had gallbladder removed on 11/11 and c/o sweating, nausea and pain in RUQ area of abdomen.

## 2022-11-29 ENCOUNTER — Telehealth: Payer: Self-pay | Admitting: Student

## 2022-11-29 ENCOUNTER — Encounter: Payer: Self-pay | Admitting: Student

## 2022-11-29 ENCOUNTER — Emergency Department (HOSPITAL_BASED_OUTPATIENT_CLINIC_OR_DEPARTMENT_OTHER)
Admission: EM | Admit: 2022-11-29 | Discharge: 2022-11-29 | Disposition: A | Discharge status: Home / Self Care | Payer: 59 | Attending: Emergency Medicine | Admitting: Emergency Medicine

## 2022-11-29 ENCOUNTER — Other Ambulatory Visit: Payer: Self-pay

## 2022-11-29 ENCOUNTER — Emergency Department (HOSPITAL_BASED_OUTPATIENT_CLINIC_OR_DEPARTMENT_OTHER): Payer: 59

## 2022-11-29 DIAGNOSIS — K269 Duodenal ulcer, unspecified as acute or chronic, without hemorrhage or perforation: Secondary | ICD-10-CM | POA: Diagnosis not present

## 2022-11-29 DIAGNOSIS — D72829 Elevated white blood cell count, unspecified: Secondary | ICD-10-CM | POA: Insufficient documentation

## 2022-11-29 DIAGNOSIS — R10819 Abdominal tenderness, unspecified site: Secondary | ICD-10-CM | POA: Diagnosis not present

## 2022-11-29 DIAGNOSIS — N200 Calculus of kidney: Secondary | ICD-10-CM | POA: Diagnosis not present

## 2022-11-29 DIAGNOSIS — R109 Unspecified abdominal pain: Secondary | ICD-10-CM | POA: Diagnosis not present

## 2022-11-29 DIAGNOSIS — K429 Umbilical hernia without obstruction or gangrene: Secondary | ICD-10-CM | POA: Diagnosis not present

## 2022-11-29 DIAGNOSIS — R1011 Right upper quadrant pain: Secondary | ICD-10-CM | POA: Diagnosis not present

## 2022-11-29 LAB — COMPREHENSIVE METABOLIC PANEL
ALT: 51 U/L — ABNORMAL HIGH (ref 0–44)
AST: 24 U/L (ref 15–41)
Albumin: 4.3 g/dL (ref 3.5–5.0)
Alkaline Phosphatase: 61 U/L (ref 38–126)
Anion gap: 13 (ref 5–15)
BUN: 19 mg/dL (ref 6–20)
CO2: 20 mmol/L — ABNORMAL LOW (ref 22–32)
Calcium: 9.8 mg/dL (ref 8.9–10.3)
Chloride: 106 mmol/L (ref 98–111)
Creatinine, Ser: 1.27 mg/dL — ABNORMAL HIGH (ref 0.61–1.24)
GFR, Estimated: >60 mL/min (ref >60)
Glucose, Bld: 132 mg/dL — ABNORMAL HIGH (ref 70–99)
Potassium: 3.8 mmol/L (ref 3.5–5.1)
Sodium: 139 mmol/L (ref 135–145)
Total Bilirubin: 0.2 mg/dL (ref <1.2)
Total Protein: 7.6 g/dL (ref 6.5–8.1)

## 2022-11-29 LAB — CBC WITH DIFFERENTIAL/PLATELET
Abs Immature Granulocytes: 0.06 10*3/uL (ref 0.00–0.07)
Basophils Absolute: 0.1 10*3/uL (ref 0.0–0.1)
Basophils Relative: 1 %
Eosinophils Absolute: 0.4 10*3/uL (ref 0.0–0.5)
Eosinophils Relative: 3 %
HCT: 31.4 % — ABNORMAL LOW (ref 39.0–52.0)
Hemoglobin: 10.4 g/dL — ABNORMAL LOW (ref 13.0–17.0)
Immature Granulocytes: 1 %
Lymphocytes Relative: 23 %
Lymphs Abs: 2.9 10*3/uL (ref 0.7–4.0)
MCH: 28.3 pg (ref 26.0–34.0)
MCHC: 33.1 g/dL (ref 30.0–36.0)
MCV: 85.3 fL (ref 80.0–100.0)
Monocytes Absolute: 0.7 10*3/uL (ref 0.1–1.0)
Monocytes Relative: 5 %
Neutro Abs: 8.5 10*3/uL — ABNORMAL HIGH (ref 1.7–7.7)
Neutrophils Relative %: 67 %
Platelets: 694 10*3/uL — ABNORMAL HIGH (ref 150–400)
RBC: 3.68 MIL/uL — ABNORMAL LOW (ref 4.22–5.81)
RDW: 14.6 % (ref 11.5–15.5)
WBC: 12.6 10*3/uL — ABNORMAL HIGH (ref 4.0–10.5)
nRBC: 0 % (ref 0.0–0.2)

## 2022-11-29 LAB — URINALYSIS, W/ REFLEX TO CULTURE (INFECTION SUSPECTED)
Bacteria, UA: NONE SEEN
Bilirubin Urine: NEGATIVE
Glucose, UA: NEGATIVE mg/dL
Hgb urine dipstick: NEGATIVE
Ketones, ur: NEGATIVE mg/dL
Leukocytes,Ua: NEGATIVE
Nitrite: NEGATIVE
Protein, ur: NEGATIVE mg/dL
Specific Gravity, Urine: 1.022 (ref 1.005–1.030)
pH: 5.5 (ref 5.0–8.0)

## 2022-11-29 LAB — LACTIC ACID, PLASMA: Lactic Acid, Venous: 1.4 mmol/L (ref 0.5–1.9)

## 2022-11-29 LAB — LIPASE, BLOOD: Lipase: 16 U/L (ref 11–51)

## 2022-11-29 MED ORDER — ONDANSETRON HCL 4 MG PO TABS
4.0000 mg | ORAL_TABLET | Freq: Three times a day (TID) | ORAL | 0 refills | Status: AC | PRN
  Filled 2022-11-29: qty 18, 6d supply, fill #0

## 2022-11-29 MED ORDER — FAMOTIDINE 20 MG PO TABS
20.0000 mg | ORAL_TABLET | Freq: Once | ORAL | Status: AC
  Administered 2022-11-29: 20 mg via ORAL
  Filled 2022-11-29: qty 1

## 2022-11-29 MED ORDER — IOHEXOL 300 MG/ML  SOLN
100.0000 mL | Freq: Once | INTRAMUSCULAR | Status: AC | PRN
  Administered 2022-11-29: 75 mL via INTRAVENOUS

## 2022-11-29 MED ORDER — ALUM & MAG HYDROXIDE-SIMETH 200-200-20 MG/5ML PO SUSP
15.0000 mL | Freq: Once | ORAL | Status: AC
  Administered 2022-11-29: 15 mL via ORAL
  Filled 2022-11-29: qty 30

## 2022-11-29 MED ORDER — PANTOPRAZOLE SODIUM 40 MG PO TBEC
40.0000 mg | DELAYED_RELEASE_TABLET | Freq: Every day | ORAL | 0 refills | Status: AC
  Filled 2022-11-29: qty 30, 30d supply, fill #0

## 2022-11-29 MED ORDER — LACTATED RINGERS IV BOLUS
1000.0000 mL | Freq: Once | INTRAVENOUS | Status: AC
  Administered 2022-11-29: 1000 mL via INTRAVENOUS

## 2022-11-29 MED ORDER — FENTANYL CITRATE PF 50 MCG/ML IJ SOSY
25.0000 ug | PREFILLED_SYRINGE | Freq: Once | INTRAMUSCULAR | Status: AC
  Administered 2022-11-29: 25 ug via INTRAVENOUS
  Filled 2022-11-29: qty 1

## 2022-11-29 MED ORDER — MORPHINE SULFATE (PF) 4 MG/ML IV SOLN
4.0000 mg | Freq: Once | INTRAVENOUS | Status: AC
  Administered 2022-11-29: 4 mg via INTRAVENOUS
  Filled 2022-11-29: qty 1

## 2022-11-29 MED ORDER — ONDANSETRON HCL 4 MG/2ML IJ SOLN
4.0000 mg | Freq: Once | INTRAMUSCULAR | Status: AC
  Administered 2022-11-29: 4 mg via INTRAVENOUS
  Filled 2022-11-29: qty 2

## 2022-11-29 NOTE — ED Notes (Signed)
Water given for PO challenge.

## 2022-11-29 NOTE — Telephone Encounter (Signed)
Called pt to discuss labs from recent hospitalization showing active hep C infection. Pt did not pick up phone. Left HIPAA compliant VM for pt to call our clinic and ask for call back if desired. I will also mail a letter with the results.

## 2022-11-29 NOTE — Telephone Encounter (Signed)
-----   Message from Carney Living sent at 11/29/2022 11:33 AM EST ----- Regarding: RE: Pos Hep C Yes I think that's best if you can easily get him.  Also mention in the DC summary  THANKS! ----- Message ----- From: Erick Alley, DO Sent: 11/28/2022   4:22 PM EST To: Carney Living, MD Subject: RE: Pos Hep C                                  Yes, we did know. But we didn't know if he had been treated for it or if it was an active infection is my understanding. Should I call him to let him know he needs to follow up with his PCP regarding this? ----- Message ----- From: Carney Living, MD Sent: 11/28/2022   3:40 PM EST To: Erick Alley, DO Subject: Pos Hep C                                      Not sure if you saw this?   I think we knew he was Hep C positive?  Thanks  Nedra Hai ----- Message ----- From: Leory Plowman, Lab In [place] Sent: 11/23/2022   1:36 PM EST To: Carney Living, MD

## 2022-11-29 NOTE — Discharge Instructions (Addendum)
As discussed, workup today overall reassuring.  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  Mild inflammatory changes where your gallbladder was taken out.  Plan is to place you on the PPI in the form of Protonix to help with treatment of your duodenal ulcer.  Will also send you home with nausea medicine.  The general surgeon wants to see you tomorrow.  Attached is their information to call early in the morning to let them know you are coming.  They should be expecting you.  Please do not hesitate to return if you develop worsening abdominal pain, fever, intractable vomiting/nausea, vomiting blood or other abnormality we discussed.Marland Kitchen

## 2022-11-29 NOTE — ED Triage Notes (Signed)
Abd pain since gallbladder surg 11/11. RLQ pain today. Afebrile in triage. Chills, body aches. Wounds appear well healed-glue intact.

## 2022-11-29 NOTE — ED Provider Notes (Signed)
[place] EMERGENCY DEPARTMENT AT [facility] Provider Note   CSN: [id number] Arrival date & time: 11/29/22  1409     History  Chief Complaint  Patient presents with   Abdominal Pain    CAMARIE REGEN is a 37 y.o. male.   Abdominal Pain   37 year old male presents emergency department with complaints of abdominal pain.  Patient received laparoscopic cholecystectomy on 11/12 this month by Dr. Luisa Hart.  States that 3 days ago when he was sitting on the bus developed pain around his bellybutton.  Reports some radiation of pain to right upper quadrant as well as right lower quadrant.  Reports nausea without associated emesis.  States that he felt feverish yesterday and some the day before but otherwise, has not felt hot.  No documented temperature.  Denies any chest pain, shortness of breath, urinary symptoms, change in bowel habits.  Last bowel movement yesterday and regular per patient.  Past medical history significant for acute cholecystitis with cholecystectomy performed on 11/20/2022, hep C  Home Medications Prior to Admission medications   Medication Sig Start Date End Date Taking? Authorizing Provider  Buprenorphine HCl-Naloxone HCl 8-2 MG FILM Place 1 Film under the tongue 3 (three) times daily. 11/26/22  Yes [provider]  ondansetron (ZOFRAN) 4 MG tablet Take 1 tablet (4 mg total) by mouth every 8 (eight) hours as needed for nausea or vomiting. 11/29/22  Yes Sherian Maroon A, PA  pantoprazole (PROTONIX) 40 MG tablet Take 1 tablet (40 mg total) by mouth daily. 11/29/22  Yes Sherian Maroon A, PA  acetaminophen (TYLENOL) 500 MG tablet Take 2 tablets (1,000 mg total) by mouth every 6 (six) hours. 11/21/22   Barnetta Chapel, PA-C  ibuprofen (MOTRIN IB) 200 MG tablet Take 3 tablets (600 mg total) by mouth every 8 (eight) hours as needed. 11/21/22 11/21/23  Barnetta Chapel, PA-C  naloxone [facility]) nasal spray 4 mg/0.1 mL Use 1 spray (0.4 mg total) as  needed in the nose. 11/21/22   Celine Mans, MD  nicotine (NICODERM CQ - DOSED IN MG/24 HOURS) 14 mg/24hr patch Place 1 patch (14 mg total) onto the skin daily. 11/22/22   Celine Mans, MD  oxyCODONE 10 MG TABS Take 1 tablet (10 mg total) by mouth every 6 (six) hours as needed. 11/21/22   Barnetta Chapel, PA-C      Allergies    Patient has no known allergies.    Review of Systems   Review of Systems  Gastrointestinal:  Positive for abdominal pain.  All other systems reviewed and are negative.   Physical Exam Updated Vital Signs BP 120/71 (BP Location: Right Arm)   Pulse 90   Temp 98.6 F (37 C) (Oral)   Resp 17   SpO2 98%  Physical Exam Vitals and nursing note reviewed.  Constitutional:      General: He is not in acute distress.    Appearance: He is well-developed.  HENT:     Head: Normocephalic and atraumatic.  Eyes:     Conjunctiva/sclera: Conjunctivae normal.  Cardiovascular:     Rate and Rhythm: Normal rate and regular rhythm.     Heart sounds: No murmur heard. Pulmonary:     Effort: Pulmonary effort is normal. No respiratory distress.     Breath sounds: Normal breath sounds.  Abdominal:     Palpations: Abdomen is soft.     Tenderness: There is abdominal tenderness in the right upper quadrant, right lower quadrant and periumbilical area. There is no right CVA  tenderness or left CVA tenderness.  Musculoskeletal:        General: No swelling.     Cervical back: Neck supple.  Skin:    General: Skin is warm and dry.     Capillary Refill: Capillary refill takes less than 2 seconds.  Neurological:     Mental Status: He is alert.  Psychiatric:        Mood and Affect: Mood normal.     ED Results / Procedures / Treatments   Labs (all labs ordered are listed, but only abnormal results are displayed) Labs Reviewed  CBC WITH DIFFERENTIAL/PLATELET - Abnormal; Notable for the following components:      Result Value   WBC 12.6 (*)    RBC 3.68 (*)    Hemoglobin  10.4 (*)    HCT 31.4 (*)    Platelets 694 (*)    Neutro Abs 8.5 (*)    All other components within normal limits  COMPREHENSIVE METABOLIC PANEL - Abnormal; Notable for the following components:   CO2 20 (*)    Glucose, Bld 132 (*)    Creatinine, Ser 1.27 (*)    ALT 51 (*)    All other components within normal limits  LIPASE, BLOOD  LACTIC ACID, PLASMA  URINALYSIS, W/ REFLEX TO CULTURE (INFECTION SUSPECTED)    EKG None  Radiology CT ABDOMEN PELVIS W CONTRAST  Result Date: 11/29/2022 CLINICAL DATA:  Postop abdominal pain. EXAM: CT ABDOMEN AND PELVIS WITH CONTRAST TECHNIQUE: Multidetector CT imaging of the abdomen and pelvis was performed using the standard protocol following bolus administration of intravenous contrast. RADIATION DOSE REDUCTION: This exam was performed according to the departmental dose-optimization program which includes automated exposure control, adjustment of the mA and/or kV according to patient size and/or use of iterative reconstruction technique. CONTRAST:  75mL OMNIPAQUE IOHEXOL 300 MG/ML  SOLN COMPARISON:  CT abdomen pelvis dated 09/03/2011. FINDINGS: Lower chest: The visualized lung bases are clear. No intra-abdominal free air. Small free fluid in the right upper quadrant and porta hepatis. Hepatobiliary: The liver is unremarkable. Mild biliary dilatation, post cholecystectomy. No retained calcified stone noted in the central CBD. Small amount of free fluid may be postoperative. Biliary leak is not excluded. Clinical correlation is recommended. No loculated or drainable fluid collection. Pancreas: There is inflammatory changes and fluid in the right upper quadrant surrounding the uncinate process as above and related to postoperative changes or secondary to duodenal ulcer. Pancreatitis is less likely but not excluded correlation with pancreatic enzymes recommended. The pancreas is otherwise unremarkable. Spleen: Normal in size without focal abnormality.  Adrenals/Urinary Tract: The adrenal glands unremarkable. Punctate nonobstructing bilateral renal inferior pole calculi. There is no hydronephrosis on either side. There is symmetric enhancement and excretion of contrast by both kidneys. The visualized ureters and urinary bladder appear unremarkable. Stomach/Bowel: There is irregularity of the medial wall of the proximal duodenum with an 8 mm deep outpouching (coronal 54/5) suspicious for a duodenal ulcer. Fluid around the proximal duodenum may be related to cholecystectomy or inflammatory changes of the duodenal ulcer. No perforation or abscess. There is no bowel obstruction. The appendix is normal. Vascular/Lymphatic: The abdominal aorta and IVC are unremarkable. No portal venous gas. There is no adenopathy. Reproductive: The prostate and seminal vesicles are grossly unremarkable. No pelvic mass. Other: Small fat containing umbilical hernia. Induration of the umbilical fat likely related to laparoscopic port placement. Musculoskeletal: No acute osseous pathology. IMPRESSION: 1. Findings concerning for a small proximal duodenal ulcer. No perforation or  abscess. 2. Cholecystectomy. Small amount of postoperative fluid in the porta hepatis or inflammatory changes of duodenal ulcer. A biliary leak is not excluded. 3. Punctate nonobstructing bilateral renal inferior pole calculi. No hydronephrosis. 4. No bowel obstruction. Normal appendix. Electronically Signed   By: Elgie Collard M.D.   On: 11/29/2022 17:27    Procedures Procedures    Medications Ordered in ED Medications  ondansetron (ZOFRAN) injection 4 mg (4 mg Intravenous Given 11/29/22 1501)  morphine (PF) 4 MG/ML injection 4 mg (4 mg Intravenous Given 11/29/22 1459)  lactated ringers bolus 1,000 mL (0 mLs Intravenous Stopped 11/29/22 1704)  iohexol (OMNIPAQUE) 300 MG/ML solution 100 mL (75 mLs Intravenous Contrast Given 11/29/22 1547)  fentaNYL (SUBLIMAZE) injection 25 mcg (25 mcg Intravenous Given  11/29/22 1630)  alum & mag hydroxide-simeth (MAALOX/MYLANTA) 200-200-20 MG/5ML suspension 15 mL (15 mLs Oral Given 11/29/22 1812)  famotidine (PEPCID) tablet 20 mg (20 mg Oral Given 11/29/22 1813)    ED Course/ Medical Decision Making/ A&P Clinical Course as of 11/29/22 2303  Thu Nov 29, 2022  1750 Consulted general surgery Dr. Azucena Cecil who recommended beginning PPI, p.o. challenge and reassess thereafter.  Will discharge with close outpatient follow-up tomorrow with general surgery if passage of p.o.  If failure, will admit to hospital medicine.  [phone] [CR]    Clinical Course User Index [CR] Peter Garter, PA                                 Medical Decision Making Amount and/or Complexity of Data Reviewed Labs: ordered. Radiology: ordered.  Risk OTC drugs. Prescription drug management.   This patient presents to the ED for concern of abdominal pain, this involves an extensive number of treatment options, and is a complaint that carries with it a high risk of complications and morbidity.  The differential diagnosis includes abscess, ileitis, CBD pathology, gastritis, PUD, SBO/LBO, volvulus, diverticulitis, pyelonephritis, cystitis, other   Co morbidities that complicate the patient evaluation  See HPI   Additional history obtained:  Additional history obtained from EMR External records from outside source obtained and reviewed including hospital records   Lab Tests:  I Ordered, and personally interpreted labs.  The pertinent results include: Samuel Bouche cytosis of 12.6 with left shift.  Evidence of anemia with a hemoglobin of 10.4.  Thrombocytosis of 694.   Imaging Studies ordered:  I ordered imaging studies including CT abdomen pelvis I independently visualized and interpreted imaging which showed small proximal duodenal ulcer.  Cholecystectomy.  Small amount of postop fluid in point about this inflammatory changes duodenal ulcer punctate nonobstructive bilateral  renal inferior pole calculi.  No hydronephrosis.  No bowel obstruction.  Normal appendix. I agree with the radiologist interpretation  Cardiac Monitoring: / EKG:  The patient was maintained on a cardiac monitor.  I personally viewed and interpreted the cardiac monitored which showed an underlying rhythm of: Sinus rhythm   Consultations Obtained:  N/a   Problem List / ED Course / Critical interventions / Medication management  Abdominal pain I ordered medication including morphine, Zofran, fentanyl, 1 L of lactated Ringer's, Maalox, famotidine   Reevaluation of the patient after these medicines showed that the patient improved I have reviewed the patients home medicines and have made adjustments as needed   Social Determinants of Health:  Cigarette use.  Denies illicit drug use.   Test / Admission - Considered:  Abdominal pain Vitals signs within normal range  and stable throughout visit. Laboratory/imaging studies significant for: See above 37 year old male presents emergency department with complaints of abdominal pain.  Patient had laparoscopic cholecystectomy performed on 11/12 with abdominal pain began about 3 days ago.  States the pain began around his bellybutton but since its radiated to right upper and right lower quadrant.  On exam, patient with tenderness in paramedical region, right upper quadrant as well as right lower quadrant.  Labs concerning for leukocytosis of 12.6, anemia with hemoglobin 10.4 as well as slight elevation of creatinine 1.27 around 0.9-1 at baseline.  CT imaging was obtained which did evidence of small proximal, lateral ulcer as well as postop changes in right upper quadrant as above.  Consulted general surgery regarding the patient given recent cholecystectomy in the setting of right upper quadrant pain with CT imaging findings.  General surgery recommended outpatient evaluation if patient tolerated p.o.  Patient subsequently given a GI cocktail with  passage of p.o. trial.  Will discharge patient with close outpatient follow-up tomorrow by general surgery along with beginning of PPI for treatment of patient's duodenal ulcer.  Treatment plan discussed at length with patient and he acknowledged understanding was agreeable to said plan.  Patient overall well-appearing, afebrile in no acute distress. Worrisome signs and symptoms were discussed with the patient, and the patient acknowledged understanding to return to the ED if noticed. Patient was stable upon discharge.          Final Clinical Impression(s) / ED Diagnoses Final diagnoses:  Abdominal pain, unspecified abdominal location    Rx / DC Orders ED Discharge Orders          Ordered    ondansetron (ZOFRAN) 4 MG tablet  Every 8 hours PRN        11/29/22 1808    pantoprazole (PROTONIX) 40 MG tablet  Daily        11/29/22 1808              Peter Garter, [place] 11/29/22 2304    Elayne Snare K, DO 11/30/22 [phone]

## 2022-11-29 NOTE — ED Notes (Signed)
Patient transported to CT 

## 2022-11-30 ENCOUNTER — Other Ambulatory Visit (HOSPITAL_BASED_OUTPATIENT_CLINIC_OR_DEPARTMENT_OTHER): Payer: Self-pay

## 2022-12-10 ENCOUNTER — Inpatient Hospital Stay (INDEPENDENT_AMBULATORY_CARE_PROVIDER_SITE_OTHER): Payer: 59 | Admitting: Primary Care

## 2022-12-10 ENCOUNTER — Other Ambulatory Visit (HOSPITAL_BASED_OUTPATIENT_CLINIC_OR_DEPARTMENT_OTHER): Payer: Self-pay

## 2022-12-19 ENCOUNTER — Other Ambulatory Visit (HOSPITAL_COMMUNITY): Payer: Self-pay
